# Patient Record
Sex: Female | Born: 1960 | Race: White | Hispanic: No | Smoking: Former smoker
Health system: Southern US, Academic
[De-identification: ages and names within clinical notes are randomized; demographics above are authoritative.]

## PROBLEM LIST (undated history)

## (undated) ENCOUNTER — Inpatient Hospital Stay (HOSPITAL_COMMUNITY): Payer: 59 | Admitting: INTERVENTIONAL CARDIOLOGY

## (undated) ENCOUNTER — Inpatient Hospital Stay (HOSPITAL_COMMUNITY): Payer: 59 | Admitting: CARDIOVASCULAR DISEASE

## (undated) ENCOUNTER — Inpatient Hospital Stay (HOSPITAL_BASED_OUTPATIENT_CLINIC_OR_DEPARTMENT_OTHER): Payer: 59 | Admitting: INTERVENTIONAL CARDIOLOGY

## (undated) DIAGNOSIS — E785 Hyperlipidemia, unspecified: Secondary | ICD-10-CM

## (undated) DIAGNOSIS — C539 Malignant neoplasm of cervix uteri, unspecified: Secondary | ICD-10-CM

## (undated) DIAGNOSIS — F329 Major depressive disorder, single episode, unspecified: Secondary | ICD-10-CM

## (undated) DIAGNOSIS — K589 Irritable bowel syndrome without diarrhea: Secondary | ICD-10-CM

## (undated) DIAGNOSIS — I10 Essential (primary) hypertension: Secondary | ICD-10-CM

## (undated) DIAGNOSIS — M5090 Cervical disc disorder, unspecified, unspecified cervical region: Secondary | ICD-10-CM

## (undated) DIAGNOSIS — K644 Residual hemorrhoidal skin tags: Secondary | ICD-10-CM

## (undated) DIAGNOSIS — M797 Fibromyalgia: Secondary | ICD-10-CM

## (undated) DIAGNOSIS — K648 Other hemorrhoids: Secondary | ICD-10-CM

## (undated) DIAGNOSIS — F32A Depression, unspecified: Secondary | ICD-10-CM

## (undated) DIAGNOSIS — F419 Anxiety disorder, unspecified: Secondary | ICD-10-CM

## (undated) DIAGNOSIS — B0221 Postherpetic geniculate ganglionitis: Secondary | ICD-10-CM

## (undated) DIAGNOSIS — M6289 Other specified disorders of muscle: Secondary | ICD-10-CM

## (undated) DIAGNOSIS — K219 Gastro-esophageal reflux disease without esophagitis: Secondary | ICD-10-CM

## (undated) DIAGNOSIS — I2089 Other forms of angina pectoris (CMS HCC): Secondary | ICD-10-CM

## (undated) DIAGNOSIS — G8929 Other chronic pain: Secondary | ICD-10-CM

## (undated) DIAGNOSIS — F39 Unspecified mood [affective] disorder: Secondary | ICD-10-CM

## (undated) DIAGNOSIS — E0500 Thyrotoxicosis with diffuse goiter without thyrotoxic crisis or storm: Secondary | ICD-10-CM

## (undated) DIAGNOSIS — E079 Disorder of thyroid, unspecified: Secondary | ICD-10-CM

## (undated) DIAGNOSIS — I219 Acute myocardial infarction, unspecified: Secondary | ICD-10-CM

## (undated) DIAGNOSIS — J449 Chronic obstructive pulmonary disease, unspecified: Secondary | ICD-10-CM

## (undated) DIAGNOSIS — H25813 Combined forms of age-related cataract, bilateral: Secondary | ICD-10-CM

## (undated) DIAGNOSIS — H04129 Dry eye syndrome of unspecified lacrimal gland: Secondary | ICD-10-CM

## (undated) DIAGNOSIS — I6529 Occlusion and stenosis of unspecified carotid artery: Secondary | ICD-10-CM

## (undated) DIAGNOSIS — K3184 Gastroparesis: Secondary | ICD-10-CM

## (undated) DIAGNOSIS — Z79891 Long term (current) use of opiate analgesic: Secondary | ICD-10-CM

## (undated) DIAGNOSIS — M1990 Unspecified osteoarthritis, unspecified site: Secondary | ICD-10-CM

## (undated) DIAGNOSIS — R110 Nausea: Secondary | ICD-10-CM

## (undated) DIAGNOSIS — Z9289 Personal history of other medical treatment: Secondary | ICD-10-CM

## (undated) DIAGNOSIS — I2510 Atherosclerotic heart disease of native coronary artery without angina pectoris: Secondary | ICD-10-CM

## (undated) DIAGNOSIS — G7000 Myasthenia gravis without (acute) exacerbation: Secondary | ICD-10-CM

## (undated) DIAGNOSIS — H05839 Thyroid orbitopathy, unspecified orbit: Secondary | ICD-10-CM

## (undated) HISTORY — PX: CHOLECYSTECTOMY: SHX55

## (undated) HISTORY — DX: Hyperlipidemia, unspecified: E78.5

## (undated) HISTORY — PX: ABDOMINAL HYSTERECTOMY: SHX81

## (undated) HISTORY — DX: Essential (primary) hypertension: I10

## (undated) HISTORY — PX: OOPHORECTOMY: SHX86

## (undated) HISTORY — DX: Anxiety disorder, unspecified: F41.9

## (undated) HISTORY — PX: BREAST LUMPECTOMY: SHX2

## (undated) HISTORY — DX: Major depressive disorder, single episode, unspecified: F32.9

## (undated) HISTORY — PX: INGUINAL HERNIA REPAIR: SHX194

## (undated) HISTORY — DX: Malignant neoplasm of cervix uteri, unspecified: C53.9

## (undated) HISTORY — DX: Cervical disc disorder, unspecified, unspecified cervical region: M50.90

## (undated) HISTORY — DX: Irritable bowel syndrome, unspecified: K58.9

## (undated) HISTORY — DX: Residual hemorrhoidal skin tags: K64.4

## (undated) HISTORY — DX: Fibromyalgia: M79.7

## (undated) HISTORY — DX: Depression, unspecified: F32.A

## (undated) HISTORY — PX: COLONOSCOPY: SHX174

## (undated) HISTORY — DX: Residual hemorrhoidal skin tags: K64.8

## (undated) HISTORY — DX: Disorder of thyroid, unspecified: E07.9

## (undated) HISTORY — DX: Personal history of other medical treatment: Z92.89

## (undated) HISTORY — DX: Occlusion and stenosis of unspecified carotid artery: I65.29

## (undated) HISTORY — DX: Other chronic pain: G89.29

## (undated) HISTORY — DX: Long term (current) use of opiate analgesic: Z79.891

## (undated) HISTORY — DX: Chronic obstructive pulmonary disease, unspecified: J44.9

## (undated) HISTORY — DX: Atherosclerotic heart disease of native coronary artery without angina pectoris: I25.10

## (undated) HISTORY — DX: Unspecified osteoarthritis, unspecified site: M19.90

## (undated) HISTORY — DX: Combined forms of age-related cataract, bilateral: H25.813

## (undated) HISTORY — DX: Dry eye syndrome of unspecified lacrimal gland: H04.129

## (undated) HISTORY — DX: Other specified disorders of muscle: M62.89

## (undated) HISTORY — PX: HX LYMPHADENECTOMY: SHX15

## (undated) HISTORY — PX: BREAST SURGERY: SHX581

## (undated) HISTORY — PX: HX LAPAROTOMY: SHX154

## (undated) HISTORY — PX: CARDIAC CATHETERIZATION: SHX172

## (undated) HISTORY — PX: HX VERTEBROPLASTY: SHX113

## (undated) HISTORY — DX: Unspecified mood (affective) disorder: F39

## (undated) HISTORY — DX: Gastroparesis: K31.84

## (undated) HISTORY — DX: Nausea: R11.0

## (undated) HISTORY — DX: Thyroid orbitopathy, unspecified orbit: H05.839

## (undated) HISTORY — DX: Chronic obstructive pulmonary disease, unspecified (CMS HCC): J44.9

## (undated) HISTORY — DX: Thyrotoxicosis with diffuse goiter without thyrotoxic crisis or storm: E05.00

## (undated) HISTORY — DX: Myasthenia gravis without (acute) exacerbation: G70.00

## (undated) HISTORY — DX: Gastro-esophageal reflux disease without esophagitis: K21.9

## (undated) HISTORY — PX: HX HYSTERECTOMY: SHX81

## (undated) HISTORY — DX: Postherpetic geniculate ganglionitis: B02.21

## (undated) HISTORY — DX: Other forms of angina pectoris: I20.89

## (undated) HISTORY — DX: Myasthenia gravis without (acute) exacerbation (CMS HCC): G70.00

## (undated) HISTORY — PX: ESOPHAGOGASTRODUODENOSCOPY: SHX1529

## (undated) HISTORY — PX: HX LAP CHOLECYSTECTOMY: SHX56

## (undated) HISTORY — DX: Acute myocardial infarction, unspecified: I21.9

## (undated) HISTORY — DX: Unspecified mood (affective) disorder (CMS HCC): F39

## (undated) HISTORY — PX: HX GALL BLADDER SURGERY/CHOLE: SHX55

## (undated) HISTORY — DX: Acute myocardial infarction, unspecified (CMS HCC): I21.9

## (undated) HISTORY — DX: Other forms of angina pectoris (CMS HCC): I20.89

---

## 2007-12-29 ENCOUNTER — Ambulatory Visit: Payer: Self-pay | Admitting: Internal Medicine

## 2007-12-29 DIAGNOSIS — M5412 Radiculopathy, cervical region: Secondary | ICD-10-CM | POA: Insufficient documentation

## 2007-12-29 DIAGNOSIS — M5137 Other intervertebral disc degeneration, lumbosacral region: Secondary | ICD-10-CM

## 2007-12-29 DIAGNOSIS — F17200 Nicotine dependence, unspecified, uncomplicated: Secondary | ICD-10-CM | POA: Insufficient documentation

## 2007-12-29 DIAGNOSIS — M5030 Other cervical disc degeneration, unspecified cervical region: Secondary | ICD-10-CM | POA: Insufficient documentation

## 2007-12-30 DIAGNOSIS — I10 Essential (primary) hypertension: Secondary | ICD-10-CM

## 2007-12-30 DIAGNOSIS — E785 Hyperlipidemia, unspecified: Secondary | ICD-10-CM

## 2007-12-30 DIAGNOSIS — Z8541 Personal history of malignant neoplasm of cervix uteri: Secondary | ICD-10-CM | POA: Insufficient documentation

## 2007-12-31 ENCOUNTER — Ambulatory Visit: Payer: Self-pay | Admitting: Internal Medicine

## 2008-01-03 ENCOUNTER — Telehealth: Payer: Self-pay | Admitting: Internal Medicine

## 2008-01-04 ENCOUNTER — Encounter: Admission: RE | Admit: 2008-01-04 | Discharge: 2008-01-04 | Payer: Self-pay | Admitting: Internal Medicine

## 2008-01-05 ENCOUNTER — Telehealth: Payer: Self-pay | Admitting: Internal Medicine

## 2008-01-06 ENCOUNTER — Ambulatory Visit: Payer: Self-pay | Admitting: Internal Medicine

## 2008-01-25 ENCOUNTER — Ambulatory Visit: Payer: Self-pay | Admitting: Internal Medicine

## 2008-01-25 DIAGNOSIS — F341 Dysthymic disorder: Secondary | ICD-10-CM

## 2008-04-21 ENCOUNTER — Ambulatory Visit: Payer: Self-pay | Admitting: Internal Medicine

## 2008-06-05 ENCOUNTER — Ambulatory Visit: Payer: Self-pay | Admitting: Internal Medicine

## 2008-07-14 ENCOUNTER — Ambulatory Visit: Payer: Self-pay | Admitting: Internal Medicine

## 2008-07-18 ENCOUNTER — Telehealth: Payer: Self-pay | Admitting: Internal Medicine

## 2008-07-19 ENCOUNTER — Emergency Department (HOSPITAL_COMMUNITY): Admission: EM | Admit: 2008-07-19 | Discharge: 2008-07-19 | Payer: Self-pay | Admitting: Emergency Medicine

## 2008-07-20 ENCOUNTER — Ambulatory Visit: Payer: Self-pay | Admitting: Internal Medicine

## 2008-07-22 ENCOUNTER — Encounter: Admission: RE | Admit: 2008-07-22 | Discharge: 2008-07-22 | Payer: Self-pay | Admitting: Internal Medicine

## 2008-07-25 ENCOUNTER — Telehealth: Payer: Self-pay | Admitting: Internal Medicine

## 2008-07-28 ENCOUNTER — Telehealth: Payer: Self-pay | Admitting: Internal Medicine

## 2008-07-28 ENCOUNTER — Encounter: Payer: Self-pay | Admitting: Internal Medicine

## 2008-07-31 ENCOUNTER — Ambulatory Visit: Payer: Self-pay | Admitting: Internal Medicine

## 2008-10-11 ENCOUNTER — Telehealth: Payer: Self-pay | Admitting: Internal Medicine

## 2008-10-12 ENCOUNTER — Telehealth: Payer: Self-pay | Admitting: Internal Medicine

## 2008-11-09 ENCOUNTER — Telehealth: Payer: Self-pay | Admitting: Internal Medicine

## 2008-12-05 ENCOUNTER — Ambulatory Visit: Payer: Self-pay | Admitting: Internal Medicine

## 2008-12-07 ENCOUNTER — Telehealth (INDEPENDENT_AMBULATORY_CARE_PROVIDER_SITE_OTHER): Payer: Self-pay | Admitting: *Deleted

## 2008-12-08 ENCOUNTER — Telehealth (INDEPENDENT_AMBULATORY_CARE_PROVIDER_SITE_OTHER): Payer: Self-pay | Admitting: *Deleted

## 2008-12-11 ENCOUNTER — Encounter: Payer: Self-pay | Admitting: Internal Medicine

## 2008-12-11 ENCOUNTER — Ambulatory Visit: Payer: Self-pay

## 2008-12-13 ENCOUNTER — Encounter: Payer: Self-pay | Admitting: Internal Medicine

## 2009-01-22 ENCOUNTER — Telehealth: Payer: Self-pay | Admitting: Internal Medicine

## 2009-01-24 ENCOUNTER — Ambulatory Visit: Payer: Self-pay | Admitting: Internal Medicine

## 2009-05-02 ENCOUNTER — Ambulatory Visit: Payer: Self-pay | Admitting: Internal Medicine

## 2009-05-23 ENCOUNTER — Ambulatory Visit: Payer: Self-pay | Admitting: Internal Medicine

## 2009-06-04 ENCOUNTER — Telehealth: Payer: Self-pay | Admitting: Internal Medicine

## 2009-07-03 ENCOUNTER — Ambulatory Visit: Payer: Self-pay | Admitting: Internal Medicine

## 2009-07-03 LAB — CONVERTED CEMR LAB
ALT: 12 units/L (ref 0–35)
Amylase: 116 units/L (ref 27–131)
BUN: 10 mg/dL (ref 6–23)
Basophils Absolute: 0 10*3/uL (ref 0.0–0.1)
Chloride: 103 meq/L (ref 96–112)
Glucose, Bld: 98 mg/dL (ref 70–99)
HCT: 39.2 % (ref 36.0–46.0)
Lymphs Abs: 2.8 10*3/uL (ref 0.7–4.0)
MCHC: 34.5 g/dL (ref 30.0–36.0)
MCV: 92.4 fL (ref 78.0–100.0)
Monocytes Absolute: 0.4 10*3/uL (ref 0.1–1.0)
Platelets: 323 10*3/uL (ref 150.0–400.0)
Potassium: 4.3 meq/L (ref 3.5–5.1)
RDW: 12.9 % (ref 11.5–14.6)
TSH: 0.79 microintl units/mL (ref 0.35–5.50)
Total Bilirubin: 0.5 mg/dL (ref 0.3–1.2)
Urine Glucose: NEGATIVE mg/dL
Urobilinogen, UA: 0.2 (ref 0.0–1.0)

## 2009-07-04 ENCOUNTER — Telehealth: Payer: Self-pay | Admitting: Internal Medicine

## 2009-07-10 ENCOUNTER — Telehealth: Payer: Self-pay | Admitting: Internal Medicine

## 2009-07-18 ENCOUNTER — Encounter: Payer: Self-pay | Admitting: Internal Medicine

## 2009-07-18 LAB — HM MAMMOGRAPHY: HM Mammogram: NORMAL

## 2009-08-15 ENCOUNTER — Ambulatory Visit: Payer: Self-pay | Admitting: Internal Medicine

## 2009-08-15 DIAGNOSIS — M549 Dorsalgia, unspecified: Secondary | ICD-10-CM | POA: Insufficient documentation

## 2009-09-04 ENCOUNTER — Telehealth: Payer: Self-pay | Admitting: Internal Medicine

## 2009-09-04 ENCOUNTER — Ambulatory Visit: Payer: Self-pay | Admitting: Internal Medicine

## 2009-09-06 ENCOUNTER — Ambulatory Visit (HOSPITAL_COMMUNITY): Admission: RE | Admit: 2009-09-06 | Discharge: 2009-09-06 | Payer: Self-pay | Admitting: Internal Medicine

## 2009-09-13 ENCOUNTER — Telehealth: Payer: Self-pay | Admitting: Internal Medicine

## 2009-10-04 ENCOUNTER — Telehealth: Payer: Self-pay | Admitting: Internal Medicine

## 2009-10-12 ENCOUNTER — Emergency Department (HOSPITAL_COMMUNITY): Admission: EM | Admit: 2009-10-12 | Discharge: 2009-10-12 | Payer: Self-pay | Admitting: Family Medicine

## 2009-10-17 ENCOUNTER — Other Ambulatory Visit: Admission: RE | Admit: 2009-10-17 | Discharge: 2009-10-17 | Payer: Self-pay | Admitting: Internal Medicine

## 2009-10-17 ENCOUNTER — Ambulatory Visit: Payer: Self-pay | Admitting: Internal Medicine

## 2009-10-17 LAB — CONVERTED CEMR LAB
ALT: 19 units/L (ref 0–35)
Alkaline Phosphatase: 81 units/L (ref 39–117)
Basophils Absolute: 0 10*3/uL (ref 0.0–0.1)
Bilirubin, Direct: 0.1 mg/dL (ref 0.0–0.3)
CO2: 27 meq/L (ref 19–32)
Chloride: 103 meq/L (ref 96–112)
Cholesterol: 249 mg/dL — ABNORMAL HIGH (ref 0–200)
HDL: 47.5 mg/dL (ref >39.00)
Hemoglobin: 12.8 g/dL (ref 12.0–15.0)
Lymphocytes Relative: 24.6 % (ref 12.0–46.0)
Monocytes Relative: 3.6 % (ref 3.0–12.0)
Neutrophils Relative %: 69.1 % (ref 43.0–77.0)
Platelets: 268 10*3/uL (ref 150.0–400.0)
Potassium: 4.4 meq/L (ref 3.5–5.1)
RDW: 13.1 % (ref 11.5–14.6)
Sodium: 137 meq/L (ref 135–145)
Total Bilirubin: 0.4 mg/dL (ref 0.3–1.2)
Total CHOL/HDL Ratio: 5
Total Protein: 6.8 g/dL (ref 6.0–8.3)
Triglycerides: 248 mg/dL — ABNORMAL HIGH (ref 0.0–149.0)
VLDL: 49.6 mg/dL — ABNORMAL HIGH (ref 0.0–40.0)

## 2009-10-17 LAB — HM PAP SMEAR

## 2009-11-28 ENCOUNTER — Encounter (INDEPENDENT_AMBULATORY_CARE_PROVIDER_SITE_OTHER): Payer: Self-pay | Admitting: *Deleted

## 2009-11-30 ENCOUNTER — Ambulatory Visit: Payer: Self-pay | Admitting: Internal Medicine

## 2009-12-04 ENCOUNTER — Telehealth: Payer: Self-pay | Admitting: Internal Medicine

## 2009-12-05 ENCOUNTER — Ambulatory Visit: Payer: Self-pay | Admitting: Internal Medicine

## 2009-12-07 ENCOUNTER — Telehealth: Payer: Self-pay | Admitting: Internal Medicine

## 2009-12-12 ENCOUNTER — Telehealth: Payer: Self-pay | Admitting: Internal Medicine

## 2009-12-25 ENCOUNTER — Telehealth: Payer: Self-pay | Admitting: Internal Medicine

## 2009-12-26 ENCOUNTER — Ambulatory Visit: Payer: Self-pay | Admitting: Internal Medicine

## 2009-12-26 DIAGNOSIS — G43009 Migraine without aura, not intractable, without status migrainosus: Secondary | ICD-10-CM | POA: Insufficient documentation

## 2010-01-10 ENCOUNTER — Ambulatory Visit: Payer: Self-pay | Admitting: Internal Medicine

## 2010-01-10 DIAGNOSIS — R112 Nausea with vomiting, unspecified: Secondary | ICD-10-CM

## 2010-01-10 LAB — CONVERTED CEMR LAB
ALT: 14 units/L (ref 0–35)
AST: 18 units/L (ref 0–37)
Albumin: 4.7 g/dL (ref 3.5–5.2)
BUN: 20 mg/dL (ref 6–23)
Chloride: 105 meq/L (ref 96–112)
Eosinophils Relative: 2.7 % (ref 0.0–5.0)
Glucose, Bld: 91 mg/dL (ref 70–99)
HCT: 39.5 % (ref 36.0–46.0)
Hemoglobin: 13.7 g/dL (ref 12.0–15.0)
Lymphs Abs: 2.5 10*3/uL (ref 0.7–4.0)
MCV: 94.3 fL (ref 78.0–100.0)
Monocytes Absolute: 0.3 10*3/uL (ref 0.1–1.0)
Neutro Abs: 6.6 10*3/uL (ref 1.4–7.7)
Platelets: 298 10*3/uL (ref 150.0–400.0)
Potassium: 5 meq/L (ref 3.5–5.1)
RDW: 12.7 % (ref 11.5–14.6)
Sed Rate: 13 mm/hr (ref 0–22)
Sodium: 140 meq/L (ref 135–145)
TSH: 1 microintl units/mL (ref 0.35–5.50)
Total Bilirubin: 0.5 mg/dL (ref 0.3–1.2)
WBC: 9.7 10*3/uL (ref 4.5–10.5)

## 2010-01-16 ENCOUNTER — Emergency Department (HOSPITAL_COMMUNITY): Admission: EM | Admit: 2010-01-16 | Discharge: 2010-01-16 | Payer: Self-pay | Admitting: Emergency Medicine

## 2010-01-16 ENCOUNTER — Telehealth: Payer: Self-pay | Admitting: Internal Medicine

## 2010-01-23 ENCOUNTER — Telehealth: Payer: Self-pay | Admitting: Internal Medicine

## 2010-01-24 ENCOUNTER — Ambulatory Visit: Payer: Self-pay | Admitting: Internal Medicine

## 2010-03-21 ENCOUNTER — Ambulatory Visit: Payer: Self-pay | Admitting: Internal Medicine

## 2010-04-25 ENCOUNTER — Telehealth: Payer: Self-pay | Admitting: Internal Medicine

## 2010-04-30 NOTE — Progress Notes (Signed)
Summary: OV?   Phone Note Call from Patient   Summary of Call: Pt c/o seeing "halo's", eye pain, h/a's and nausea x 2 wks. Should she see PCP or eye md?  Initial call taken by: Lamar Sprinkles, CMA,  December 25, 2009 9:43 AM  Follow-up for Phone Call        eye md Follow-up by: Etta Grandchild MD,  December 25, 2009 10:26 AM  Additional Follow-up for Phone Call Additional follow up Details #1::        Pt informed, she saw her eye md today and they said PCP must eval. Pt scheduled for office visit tomorrow.  Additional Follow-up by: Lamar Sprinkles, CMA,  December 25, 2009 11:55 AM

## 2010-04-30 NOTE — Progress Notes (Signed)
Summary: refills  Phone Note Refill Request   Refills Requested: Medication #1:  TEMAZEPAM 30 MG CAPS Take 1 tab by mouth at bedtime   Dosage confirmed as above?Dosage Confirmed   Supply Requested: 1 month  Medication #2:  VICODIN ES 7.5-750 MG TABS 3-4 once daily   Dosage confirmed as above?Dosage Confirmed   Supply Requested: 1 month  Are these rx ok to refill for patient?   Method Requested: Telephone to Pharmacy Initial call taken by: Rock Nephew CMA,  June 04, 2009 9:06 AM  Follow-up for Phone Call        yes Follow-up by: Etta Grandchild MD,  June 04, 2009 10:40 AM    Prescriptions: TEMAZEPAM 30 MG CAPS (TEMAZEPAM) Take 1 tab by mouth at bedtime  #30 x 3   Entered by:   Rock Nephew CMA   Authorized by:   Etta Grandchild MD   Signed by:   Rock Nephew CMA on 06/05/2009   Method used:   Telephoned to ...       [facility]  [address]  [phone]* (retail)       [address]       [place], [place]  [address]       Ph: [phone] or [phone]       Fax: [phone]   RxID:   [id number] VICODIN ES 7.5-750 MG TABS (HYDROCODONE-ACETAMINOPHEN) 3-4 once daily  #120 x 3   Entered by:   Rock Nephew CMA   Authorized by:   Etta Grandchild MD   Signed by:   Rock Nephew CMA on 06/05/2009   Method used:   Telephoned to ...       [facility]  [address]  [phone]* (retail)       [address]       [place], [place]  [address]       Ph: [phone] or [phone]       Fax: [phone]   RxID:   [id number]

## 2010-04-30 NOTE — Procedures (Signed)
Summary: Colonoscopy  Patient: [name] Note: All result statuses are Final unless otherwise noted.  Tests: (1) Colonoscopy (COL)   COL Colonoscopy           DONE     [facility]     [address]     [place], [place]  [address]           COLONOSCOPY PROCEDURE REPORT           PATIENT:  Barbara [name], Barbara [name]  MR#:  [id number]     BIRTHDATE:  1961-02-16, 49 yrs. old  GENDER:  female     ENDOSCOPIST:  Iva Boop, MD, [facility]     REF. BY:  Etta Grandchild, M.D.     PROCEDURE DATE:  01/24/2010     PROCEDURE:  Colonoscopy 19[address]     ASA CLASS:  Class II     INDICATIONS:  Routine Risk Screening     MEDICATIONS:   Fentanyl 100 mcg IV, Versed 12 mg IV, Benadryl 12.5     mg IV           DESCRIPTION OF PROCEDURE:   After the risks benefits and     alternatives of the procedure were thoroughly explained, informed     consent was obtained.  Digital rectal exam was performed and     revealed no abnormalities.   The LB [id number]-[id number] endoscope     was introduced through the anus and advanced to the cecum, which     was identified by both the appendix and ileocecal valve, without     limitations.  The quality of the prep was good, using MoviPrep.     The instrument was then slowly withdrawn as the colon was fully     examined. Insertion: 4:36 minutes Withdrawal: 8:15 minutes     <<PROCEDUREIMAGES>>           FINDINGS:  A normal appearing cecum, ileocecal valve, and     appendiceal orifice were identified. The ascending, hepatic     flexure, transverse, splenic flexure, descending, sigmoid colon,     and rectum appeared unremarkable.   Retroflexed views in the     rectum revealed internal and external hemorrhoids.    The scope     was then withdrawn from the patient and the procedure completed.           COMPLICATIONS:  None     ENDOSCOPIC IMPRESSION:     1) Internal and external hemorrhoids     2) Normal colonoscopy otherwise     3) Suboptimal response to moderate sedation    RECOMMENDATIONS:     Deep sedation (Propofol) for future procedures           REPEAT EXAM:  In 10 year(s) for routine screening colonoscopy.           Iva Boop, MD, [facility]           CC:  The Patient     Etta Grandchild, MD           n.     Rosalie Doctor:   Iva Boop at 01/24/2010 12:24 PM           Marva Panda, [phone]  Note: An exclamation mark (!) indicates a result that was not dispersed into the flowsheet. Document Creation Date: 01/24/2010 12:25 PM _______________________________________________________________________  (1) Order result status: Final Collection or observation date-time: 01/24/2010 12:17 Requested date-time:  Receipt date-time:  Reported date-time:  Referring Physician:   Ordering Physician: Stan Head [phone]) Specimen Source:  Source: Launa Grill Order Number: [id number] Lab site:   Appended Document: Colonoscopy    Clinical Lists Changes  Observations: Added new observation of COLONNXTDUE: 12/2019 (01/24/2010 12:48)

## 2010-04-30 NOTE — Letter (Signed)
Summary: Barbara [name] Instructions  Barbara [name]  [address] [place], [place] [address]   Phone: [phone]  Fax: [phone]       Barbara [name]    01-14-1961    MRN: [id number]        Procedure Day Barbara [name]:  Barbara [name]  12/13/09     Arrival Time:  12:30PM      Procedure Time:  1:30PM     Location of Procedure:                    _X _  Barbara [name] (4th Floor)  PREPARATION FOR COLONOSCOPY WITH MOVIPREP   Starting 5 days prior to your procedure 12/08/09 do not eat nuts, seeds, popcorn, corn, beans, peas,  salads, or any raw vegetables.  Do not take any fiber supplements (e.g. Metamucil, Citrucel, and Benefiber).  THE DAY BEFORE YOUR PROCEDURE         DATE: 12/12/09  DAY: WEDNESDAY  1.  Drink clear liquids the entire day-NO SOLID FOOD  2.  Do not drink anything colored red or purple.  Avoid juices with pulp.  No orange juice.  3.  Drink at least 64 oz. (8 glasses) of fluid/clear liquids during the day to prevent dehydration and help the prep work efficiently.  CLEAR LIQUIDS INCLUDE: Water Jello Ice Popsicles Tea (sugar ok, no milk/cream) Powdered fruit flavored drinks Coffee (sugar ok, no milk/cream) Gatorade Juice: apple, white grape, white cranberry  Lemonade Clear bullion, consomm, broth Carbonated beverages (any kind) Strained chicken noodle soup Hard Candy                             4.  In the morning, mix first dose of MoviPrep solution:    Empty 1 Pouch A and 1 Pouch B into the disposable container    Add lukewarm drinking water to the top line of the container. Mix to dissolve    Refrigerate (mixed solution should be used within 24 hrs)  5.  Begin drinking the prep at 5:00 p.m. The MoviPrep container is divided by 4 marks.   Every 15 minutes drink the solution down to the next mark (approximately 8 oz) until the full liter is complete.   6.  Follow completed prep with 16 oz of clear liquid of your choice (Nothing red or purple).   Continue to drink clear liquids until bedtime.  7.  Before going to bed, mix second dose of MoviPrep solution:    Empty 1 Pouch A and 1 Pouch B into the disposable container    Add lukewarm drinking water to the top line of the container. Mix to dissolve    Refrigerate  THE DAY OF YOUR PROCEDURE      DATE: 12/13/09  DAY: THURSDAY  Beginning at 8:30AM (5 hours before procedure):         1. Every 15 minutes, drink the solution down to the next mark (approx 8 oz) until the full liter is complete.  2. Follow completed prep with 16 oz. of clear liquid of your choice.    3. You may drink clear liquids until 11:30AM (2 HOURS BEFORE PROCEDURE).   MEDICATION INSTRUCTIONS  Unless otherwise instructed, you should take regular prescription medications with a small sip of water   as early as possible the morning of your procedure.    Additional medication instructions: n/a         OTHER INSTRUCTIONS  You will need a responsible adult at least 51 years of age to accompany you and drive you home.   This person must remain in the waiting room during your procedure.  Wear loose fitting clothing that is easily removed.  Leave jewelry and other valuables at home.  However, you may wish to bring a book to read or  an iPod/MP3 player to listen to music as you wait for your procedure to start.  Remove all body piercing jewelry and leave at home.  Total time from sign-in until discharge is approximately 2-3 hours.  You should go home directly after your procedure and rest.  You can resume normal activities the  day after your procedure.  The day of your procedure you should not:   Drive   Make legal decisions   Operate machinery   Drink alcohol   Return to work  You will receive specific instructions about eating, activities and medications before you leave.    The above instructions have been reviewed and explained to me by   Sherren Kerns RN  November 30, 2009 10:49  AM    I fully understand and can verbalize these instructions _____________________________ Date _________

## 2010-04-30 NOTE — Assessment & Plan Note (Signed)
Summary: FU AND REFILLS /NWS #   Vital Signs:  Patient profile:   50 year old female Height:      59 inches Weight:      113 pounds O2 Sat:      98 % on Room air Temp:     98.3 degrees F oral Pulse rate:   72 / minute Pulse rhythm:   regular Resp:     16 per minute BP sitting:   124 / 72  (left arm)  O2 Flow:  Room air  Primary Care Provider:  Etta Grandchild MD  CC:  URI symptoms.  History of Present Illness:  URI Symptoms      This is a 50 year old woman who presents with URI symptoms.  The symptoms began 2 weeks ago.  The severity is described as mild.  The patient reports productive cough, but denies nasal congestion, clear nasal discharge, purulent nasal discharge, sore throat, earache, and sick contacts.  The patient denies fever, stiff neck, dyspnea, wheezing, rash, vomiting, diarrhea, and use of an antipyretic.  The patient also reports itchy throat and muscle aches.  The patient denies headache and severe fatigue.  Risk factors for Strep sinusitis include double sickening.  The patient denies the following risk factors for Strep sinusitis: unilateral facial pain, unilateral nasal discharge, tender adenopathy, and absence of cough.    Preventive Screening-Counseling & Management  Alcohol-Tobacco     Alcohol drinks/day: 0     Smoking Status: current     Smoking Cessation Counseling: yes     Smoke Cessation Stage: ready     Packs/Day: 1     Cans of tobacco/week: no     Passive Smoke Exposure: yes  Hep-HIV-STD-Contraception     Hepatitis Risk: no risk noted     HIV Risk: no     STD Risk: no risk noted  Medications Prior to Update: 1)  Alprazolam 0.5 Mg Tabs (Alprazolam) .... Take 1 Tablet By Mouth Two Times A Day As Needed For Anxiety 2)  Vicodin Es 7.5-750 Mg Tabs (Hydrocodone-Acetaminophen) .... 3-4 Once Daily 3)  Temazepam 30 Mg Caps (Temazepam) .... Take 1 Tab By Mouth At Bedtime 4)  Lisinopril 20 Mg Tabs (Lisinopril) .... Take 1 Tablet By Mouth Once A  Day  Allergies (verified): 1)  ! Penicillin 2)  ! Erythromycin 3)  ! Codeine  Past History:  Past Medical History: Reviewed history from 12/29/2007 and no changes required. Cervical cancer, hx of Hyperlipidemia Hypertension  Past Surgical History: Reviewed history from 12/29/2007 and no changes required. Cholecystectomy Inguinal herniorrhaphy Hysterectomy Oophorectomy  Family History: Reviewed history from 12/29/2007 and no changes required. adopted  Social History: Reviewed history from 07/20/2008 and no changes required. Occupation: she is disabled, she is  a Physicist, medical in graduate history program [employer] Single Current Smoker Drug use-no Regular exercise-yes Alcohol use-no Hepatitis Risk:  no risk noted STD Risk:  no risk noted  Review of Systems  The patient denies anorexia, fever, weight loss, chest pain, syncope, dyspnea on exertion, peripheral edema, headaches, hemoptysis, abdominal pain, difficulty walking, depression, enlarged lymph nodes, and angioedema.   Psych:  Complains of anxiety; denies alternate hallucination ( auditory/visual), depression, easily angered, easily tearful, irritability, mental problems, panic attacks, sense of great danger, suicidal thoughts/plans, and thoughts /plans of harming others.  Physical Exam  General:  alert, well-developed, well-nourished, well-hydrated, and appropriate dress.   Head:  normocephalic, atraumatic, no abnormalities observed, and no abnormalities palpated.   Ears:  R ear normal and L ear normal.   Nose:  no mucosal edema, no airflow obstruction, no intranasal foreign body, no nasal polyps, no nasal mucosal lesions, no mucosal friability, no active bleeding or clots, no sinus percussion tenderness, no septum abnormalities, and nasal dischargemucosal pallor.   Mouth:  Oral mucosa and oropharynx without lesions or exudates.  Teeth in good repair. Neck:  supple, full ROM, no masses, no thyromegaly, and no  thyroid nodules or tenderness.   Lungs:  Normal respiratory effort, chest expands symmetrically. Lungs are clear to auscultation, no crackles or wheezes. Heart:  Normal rate and regular rhythm. S1 and S2 normal without gallop, murmur, click, rub or other extra sounds. Abdomen:  soft, non-tender, normal bowel sounds, no distention, no masses, no hepatomegaly, and no splenomegaly.   Msk:  normal ROM, no joint tenderness, and no joint swelling.   Pulses:  R and L carotid,radial,femoral,dorsalis pedis and posterior tibial pulses are full and equal bilaterally Extremities:  No clubbing, cyanosis, edema, or deformity noted with normal full range of motion of all joints.   Neurologic:  No cranial nerve deficits noted. Station and gait are normal. Plantar reflexes are down-going bilaterally. DTRs are symmetrical throughout. Sensory, motor and coordinative functions appear intact. Skin:  Intact without suspicious lesions or rashes Cervical Nodes:  no anterior cervical adenopathy and no posterior cervical adenopathy.   Axillary Nodes:  no R axillary adenopathy and no L axillary adenopathy.   Psych:  Oriented X3, memory intact for recent and remote, normally interactive, good eye contact, not depressed appearing, not agitated, not suicidal, not homicidal, and slightly anxious.     Impression & Recommendations:  Problem # 1:  BRONCHITIS-ACUTE (ICD-466.0) Assessment New  Her updated medication list for this problem includes:    Avelox Abc Pack 400 Mg Tabs (Moxifloxacin hcl) ..... 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   Complete Medication List: 1)  Alprazolam 0.5 Mg Tabs (Alprazolam) .... Take 1 tablet by mouth two times a day as needed  for anxiety 2)  Vicodin Es 7.5-750 Mg Tabs (Hydrocodone-acetaminophen) .... 3-4 once daily 3)  Temazepam 30 Mg Caps (Temazepam) .... Take 1 tab by mouth at bedtime 4)  Lisinopril 20 Mg Tabs (Lisinopril) .... Take 1 tablet by mouth once a day 5)  Avelox Abc Pack 400 Mg Tabs (Moxifloxacin hcl) .... One by mouth once daily for 5 days  Patient Instructions: 1)  Please schedule a follow-up appointment in 1 month. 2)  Tobacco is very bad for your health and your loved ones! You Should stop smoking!. 3)  Stop Smoking Tips: Choose a Quit date. Cut down before the Quit date. decide what you will do as a substitute when you feel the urge to smoke(gum,toothpick,exercise). 4)  It is important that you exercise regularly at least 20 minutes 5 times a week. If you develop chest pain, have severe difficulty breathing, or feel very tired , stop exercising immediately and seek medical attention. 5)  You need to lose weight. Consider a lower calorie diet and regular exercise.  6)  Check your Blood Pressure regularly. If it is above 130/80: you should make an appointment. 7)  Take your antibiotic as prescribed until ALL of it is gone, but stop if you develop  a rash or swelling and contact our office as soon as possible. 8)  Acute sinusitis symptoms for less than 10 days are not helped by antibiotics.Use warm moist compresses, and over the counter decongestants ( only as directed). Call if no improvement in 5-7 days, sooner if increasing pain, fever, or new symptoms. Prescriptions: ALPRAZOLAM 0.5 MG TABS (ALPRAZOLAM) Take 1 tablet by mouth two times a day as needed for anxiety  #60 x 3   Entered and Authorized by:   Etta Grandchild MD   Signed by:   Etta Grandchild MD on 05/02/2009   Method used:   Print then Give to Patient   RxID:   [id number] AVELOX ABC PACK 400 MG TABS (MOXIFLOXACIN HCL) One by mouth once daily for 5 days  #5 x 0   Entered and Authorized by:   Etta Grandchild MD   Signed by:   Etta Grandchild MD on 05/02/2009   Method used:   Print then Give to Patient   RxID:   [id number]

## 2010-04-30 NOTE — Miscellaneous (Signed)
Summary: Previsit prep/RM  Clinical Lists Changes  Medications: Added new medication of MOVIPREP 100 GM  SOLR (PEG-KCL-NACL-NASULF-NA ASC-C) As per prep instructions. - Signed Rx of MOVIPREP 100 GM  SOLR (PEG-KCL-NACL-NASULF-NA ASC-C) As per prep instructions.;  #1 x 0;  Signed;  Entered by: Sherren Kerns RN;  Authorized by: Iva Boop MD, [facility];  Method used: Electronically to [facility]  [phone]*, [address], [place], [place], [place]  [address], Ph: [phone] or [phone], Fax: [phone] Observations: Added new observation of ALLERGY REV: Done (11/30/2009 10:16)    Prescriptions: MOVIPREP 100 GM  SOLR (PEG-KCL-NACL-NASULF-NA ASC-C) As per prep instructions.  #1 x 0   Entered by:   Sherren Kerns RN   Authorized by:   Iva Boop MD, [facility]   Signed by:   Sherren Kerns RN on 11/30/2009   Method used:   Electronically to        [facility]  [phone]* (retail)       [address]       [place], [place]  [address]       Ph: [phone] or [phone]       Fax: [phone]   RxID:   [id number]

## 2010-04-30 NOTE — Letter (Signed)
Summary: Lipid Letter  [facility]  [address] [place], [place] [address]   Phone: [phone]  Fax: [phone]    10/17/2009  Barbara [name] [address] [place], [place]  [address]  Dear Barbara [name]:  We have carefully reviewed your last lipid profile from  and the results are noted below with a summary of recommendations for lipid management.    Cholesterol:       249     Goal: <200   HDL "good" Cholesterol:   57.84     Goal: >40   LDL "bad" Cholesterol:   160     Goal: <130   Triglycerides:       248.0     Goal: <150   WOW!        TLC Diet (Therapeutic Lifestyle Change): Saturated Fats & Transfatty acids should be kept < 7% of total calories ***Reduce Saturated Fats Polyunstaurated Fat can be up to 10% of total calories Monounsaturated Fat Fat can be up to 20% of total calories Total Fat should be no greater than 25-35% of total calories Carbohydrates should be 50-60% of total calories Protein should be approximately 15% of total calories Fiber should be at least 20-30 grams a day ***Increased fiber may help lower LDL Total Cholesterol should be < 200mg /day Consider adding plant stanol/sterols to diet (example: Benacol spread) ***A higher intake of unsaturated fat may reduce Triglycerides and Increase HDL    Adjunctive Measures (may lower LIPIDS and reduce risk of Heart Attack) include: Aerobic Exercise (20-30 minutes 3-4 times a week) Limit Alcohol Consumption Weight Reduction Aspirin 75-81 mg a day by mouth (if not allergic or contraindicated) Dietary Fiber 20-30 grams a day by mouth     Current Medications: 1)    Alprazolam 0.5 Mg Tabs (Alprazolam) .... Take 1 tablet by mouth two times a day as needed for anxiety 2)    Vicodin Es 7.5-750 Mg Tabs (Hydrocodone-acetaminophen) .... 3-4 once daily 3)    Temazepam 30 Mg Caps (Temazepam) .... Take 1 tab by mouth at bedtime 4)    Lisinopril 20 Mg Tabs (Lisinopril) .... Take 1 tablet by mouth once a  day 5)    Celebrex 200 Mg Caps (Celecoxib) .... One by mouth once daily as needed for pain  If you have any questions, please call. We appreciate being able to work with you.   Sincerely,    [facility] Etta Grandchild MD

## 2010-04-30 NOTE — Assessment & Plan Note (Signed)
Summary: back/neck pain/#/cd   Vital Signs:  Patient profile:   50 year old female Height:      59 inches Weight:      110.75 pounds BMI:     22.45 O2 Sat:      97 % on Room air Temp:     97.7 degrees F oral Pulse rate:   76 / minute Pulse rhythm:   regular Resp:     16 per minute BP sitting:   120 / 64  (left arm) Cuff size:   large  Vitals Entered By: Rock Nephew CMA (September 04, 2009 9:53 AM)  O2 Flow:  Room air CC: continued neck and back pain Is Patient Diabetic? No Pain Assessment Patient in pain? no        Primary Care Provider:  Etta Grandchild MD  CC:  continued neck and back pain.  History of Present Illness: She returns with a one month hx. of worsening neck pain that causes a pulling sensation over her right trapezius but does not radiate into her arms or legs. She wants to get an MRI done. Celebrex has helped with pain, especially in her knees.  Preventive Screening-Counseling & Management  Alcohol-Tobacco     Alcohol drinks/day: 0     Smoking Status: current     Smoking Cessation Counseling: yes     Smoke Cessation Stage: contemplative     Packs/Day: 0.5     Cans of tobacco/week: no     Passive Smoke Exposure: yes  Hep-HIV-STD-Contraception     Hepatitis Risk: no risk noted     HIV Risk: no     STD Risk: no risk noted  Clinical Review Panels:  Diabetes Management   Creatinine:  0.7 (07/03/2009)  CBC   WBC:  10.1 (07/03/2009)   RBC:  4.24 (07/03/2009)   Hgb:  13.6 (07/03/2009)   Hct:  39.2 (07/03/2009)   Platelets:  323.0 (07/03/2009)   MCV  92.4 (07/03/2009)   MCHC  34.5 (07/03/2009)   RDW  12.9 (07/03/2009)   PMN:  65.7 (07/03/2009)   Lymphs:  28.1 (07/03/2009)   Monos:  4.3 (07/03/2009)   Eosinophils:  1.4 (07/03/2009)   Basophil:  0.5 (07/03/2009)  Complete Metabolic Panel   Glucose:  98 (07/03/2009)   Sodium:  140 (07/03/2009)   Potassium:  4.3 (07/03/2009)   Chloride:  103 (07/03/2009)   CO2:  30 (07/03/2009)   BUN:  10  (07/03/2009)   Creatinine:  0.7 (07/03/2009)   Albumin:  4.7 (07/03/2009)   Total Protein:  7.5 (07/03/2009)   Calcium:  10.0 (07/03/2009)   Total Bili:  0.5 (07/03/2009)   Alk Phos:  92 (07/03/2009)   SGPT (ALT):  12 (07/03/2009)   SGOT (AST):  18 (07/03/2009)   Medications Prior to Update: 1)  Alprazolam 0.5 Mg Tabs (Alprazolam) .... Take 1 Tablet By Mouth Two Times A Day As Needed For Anxiety 2)  Vicodin Es 7.5-750 Mg Tabs (Hydrocodone-Acetaminophen) .... 3-4 Once Daily 3)  Temazepam 30 Mg Caps (Temazepam) .... Take 1 Tab By Mouth At Bedtime 4)  Lisinopril 20 Mg Tabs (Lisinopril) .... Take 1 Tablet By Mouth Once A Day 5)  Celebrex 200 Mg Caps (Celecoxib) .... One By Mouth Once Daily As Needed For Pain  Current Medications (verified): 1)  Alprazolam 0.5 Mg Tabs (Alprazolam) .... Take 1 Tablet By Mouth Two Times A Day As Needed For Anxiety 2)  Vicodin Es 7.5-750 Mg Tabs (Hydrocodone-Acetaminophen) .... 3-4 Once Daily 3)  Temazepam 30 Mg Caps (Temazepam) .... Take 1 Tab By Mouth At Bedtime 4)  Lisinopril 20 Mg Tabs (Lisinopril) .... Take 1 Tablet By Mouth Once A Day 5)  Celebrex 200 Mg Caps (Celecoxib) .... One By Mouth Once Daily As Needed For Pain  Allergies (verified): 1)  ! Penicillin 2)  ! Erythromycin 3)  ! 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 adopted  Social History: Reviewed history from 07/20/2008 and no changes required. 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 Neuro:  Denies brief paralysis, difficulty with concentration, disturbances in coordination, falling down, headaches, numbness, poor balance, tingling, tremors, and weakness.  Physical Exam  General:  Well-developed, well-nourished, in no acute distress; alert and oriented x 3.   Head:  normocephalic, atraumatic, no abnormalities observed, and no abnormalities palpated.   Mouth:  good dentition, no exudates, no posterior lymphoid hypertrophy, no postnasal drip, no pharyngeal crowing, no lesions, no erosions, no tongue abnormalities, no leukoplakia, no petechiae, pharyngeal erythema, and edentulous.   Neck:  supple, full ROM, no masses, no thyromegaly, no JVD, no carotid bruits, and no cervical lymphadenopathy.   Lungs:  normal respiratory effort, no intercostal retractions, no accessory muscle use, normal breath sounds, no dullness, no fremitus, no crackles, and no wheezes.   Heart:  normal rate, regular rhythm, no murmur, no gallop, no rub, and no JVD.   Abdomen:  soft, non-tender, normal bowel sounds, no distention, no masses, no guarding, no rigidity, no rebound tenderness, no abdominal hernia, no inguinal hernia, no hepatomegaly, and no splenomegaly.   Msk:  normal ROM, no joint tenderness, no joint swelling, no joint warmth, no redness over joints, no joint deformities, no joint instability,  no crepitation, and no muscle atrophy.   Pulses:  R and L carotid,radial,femoral,dorsalis pedis and posterior tibial pulses are full and equal bilaterally Extremities:  No clubbing, cyanosis, edema, or deformity noted with normal full range of motion of all joints.   Neurologic:  No cranial nerve deficits noted. Station and gait are normal. Plantar reflexes are down-going bilaterally. DTRs are symmetrical throughout. Sensory, motor and coordinative functions appear intact. Skin:  turgor normal, color normal, no rashes, no suspicious lesions, no ecchymoses, no petechiae, no purpura, no ulcerations, and no edema.   Cervical Nodes:  no anterior cervical adenopathy and no posterior cervical adenopathy.   Psych:  Cognition and judgment appear intact. Alert and cooperative with normal attention span and concentration. 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 Take 1 tablet by mouth once a day  BP today: 120/64 Prior BP: 124/76 (08/15/2009)  Prior 10 Yr Risk Heart Disease: Not enough information (12/29/2007)  Labs Reviewed: K+: 4.3 (07/03/2009) Creat: : 0.7 (07/03/2009)     Problem # 4:  TOBACCO USE (ICD-305.1) Assessment: Unchanged  Encouraged smoking cessation and discussed different methods for smoking cessation.   Complete Medication List: 1)  Alprazolam 0.5 Mg Tabs (Alprazolam) .... Take 1 tablet by mouth two times a day as needed for anxiety 2)  Vicodin Es 7.5-750 Mg Tabs (Hydrocodone-acetaminophen) .... 3-4 once daily 3)  Temazepam 30 Mg Caps (Temazepam) .... Take 1 tab by mouth  at bedtime 4)  Lisinopril 20 Mg Tabs (Lisinopril) .... Take 1 tablet by mouth once a day 5)  Celebrex 200 Mg Caps (Celecoxib) .... One by mouth once daily as needed for pain  Patient Instructions: 1)  Please schedule a follow-up appointment in 2 months. 2)  Tobacco is very bad for your health and your loved ones! You Should stop smoking!. 3)  Stop Smoking Tips: Choose a Quit date. Cut down before the Quit date. decide what you will do as a substitute when you feel the urge to smoke(gum,toothpick,exercise). 4)  It is important that you exercise regularly at least 20 minutes 5 times a week. If you develop chest pain, have severe difficulty breathing, or feel very tired , stop exercising immediately and seek medical attention. 5)  Check your Blood Pressure regularly. If it is above 140/90: you should make an appointment. 6)  Most patients (90%) with low back pain will improve with time (2-6 weeks). Keep active but avoid activities that are painful. Apply moist heat and/or ice to lower back several times a day. Prescriptions: CELEBREX 200 MG CAPS (CELECOXIB) One by mouth once daily as needed for pain  #30 x 0   Entered and Authorized by:   Etta Grandchild MD   Signed by:   Etta Grandchild MD on 09/04/2009   Method used:   Samples Given   RxID:   [id number]

## 2010-04-30 NOTE — Progress Notes (Signed)
Summary: TO ER  Phone Note Call from Patient   Summary of Call: Pt c/o severe lower abd pain. Pain started this am but has suddenly become severe and she is having touble standing up straight and walking. No fever but also c/o increasing back pain. I advised patient to go to the ER, she agreed.  Initial call taken by: Lamar Sprinkles, CMA,  January 16, 2010 1:24 PM

## 2010-04-30 NOTE — Progress Notes (Signed)
Summary: Results  Phone Note Call from Patient Call back at Home Phone [phone]   Summary of Call: Patient is requesting results of MRI.  Initial call taken by: Lamar Sprinkles, CMA,  September 13, 2009 8:37 AM  Follow-up for Phone Call        it looks a little better then before Follow-up by: Etta Grandchild MD,  September 13, 2009 8:55 AM  Additional Follow-up for Phone Call Additional follow up Details #1::        Left detailed vm on cell # in HIPPA form Additional Follow-up by: Lamar Sprinkles, CMA,  September 13, 2009 2:53 PM

## 2010-04-30 NOTE — Progress Notes (Signed)
Summary: REFILL - Yetta Barre  Phone Note Refill Request Message from:  Pharmacy  Refills Requested: Medication #1:  VICODIN ES 7.5-750 MG TABS 3-4 once daily  Medication #2:  TEMAZEPAM 30 MG CAPS Take 1 tab by mouth at bedtime Initial call taken by: Lamar Sprinkles, CMA,  October 04, 2009 3:13 PM  Follow-up for Phone Call        done hardcopy to [facility] side B - dahlia  Follow-up by: Corwin Levins MD,  October 04, 2009 3:32 PM  Additional Follow-up for Phone Call Additional follow up Details #1::        Rx faxed to pharmacy Additional Follow-up by: Margaret Pyle, CMA,  October 04, 2009 3:35 PM    New/Updated Medications: VICODIN ES 7.5-750 MG TABS (HYDROCODONE-ACETAMINOPHEN) 3-4 once daily TEMAZEPAM 30 MG CAPS (TEMAZEPAM) Take 1 tab by mouth at bedtime Prescriptions: TEMAZEPAM 30 MG CAPS (TEMAZEPAM) Take 1 tab by mouth at bedtime  #30 x 1   Entered and Authorized by:   Corwin Levins MD   Signed by:   Corwin Levins MD on 10/04/2009   Method used:   Print then Give to Patient   RxID:   [id number] VICODIN ES 7.5-750 MG TABS (HYDROCODONE-ACETAMINOPHEN) 3-4 once daily  #120 x 1   Entered and Authorized by:   Corwin Levins MD   Signed by:   Corwin Levins MD on 10/04/2009   Method used:   Print then Give to Patient   RxID:   [id number]

## 2010-04-30 NOTE — Assessment & Plan Note (Signed)
Summary: headaches/cd   Vital Signs:  Patient profile:   50 year old female Height:      59 inches Weight:      121 pounds BMI:     24.53 O2 Sat:      96 % on Room air Temp:     98.5 degrees F oral Pulse rate:   83 / minute Pulse rhythm:   regular Resp:     16 per minute BP sitting:   132 / 70  (left arm) Cuff size:   regular  Vitals Entered By: Rock Nephew CMA (December 26, 2009 8:27 AM)  O2 Flow:  Room air  Primary Care Traveon Louro:  Etta Grandchild MD  CC:  Headaches.  History of Present Illness:  Headaches      This is a 50 year old woman who presents with Headaches.  The symptoms began 2 weeks ago.  On a scale of 1 to 10, the intensity is described as a 4.  The patient reports nausea, photophobia, and phonophobia, but denies vomiting, sweats, tearing of eyes, nasal congestion, sinus pain, and sinus pressure.  The headache is described as constant and dull.  The location of the pain is first on right and then bilateral and bitemporal.  The patient denies the following high-risk features: fever, neck pain/stiffness, vision loss or change, focal weakness, altered mental status, rash, trauma, pain worse with exertion, new type of headache, age >50 years, immunosuppression, concomitant infection, and anticoagulation use.  The headaches are precipitated by stress.  Prior treatment has included a NSAID, acetaminophen, and a narcotic.    Preventive Screening-Counseling & Management  Alcohol-Tobacco     Alcohol drinks/day: 0     Smoking Status: current     Smoking Cessation Counseling: yes     Smoke Cessation Stage: contemplative     Packs/Day: 0.5     Cans of tobacco/week: no     Passive Smoke Exposure: yes     Tobacco Counseling: to quit use of tobacco products  Hep-HIV-STD-Contraception     Hepatitis Risk: no risk noted     HIV Risk: no     STD Risk: no risk noted  Clinical Review Panels:  Prevention   Last Mammogram:  normal (07/18/2009)   Last Pap Smear:  NEGATIVE  FOR INTRAEPITHELIAL LESIONS OR MALIGNANCY. (10/17/2009)  Immunizations   Last Tetanus Booster:  Td (01/08/2007)  Lipid Management   Cholesterol:  249 (10/17/2009)   HDL (good cholesterol):  47.50 (10/17/2009)  Diabetes Management   Creatinine:  0.7 (10/17/2009)  CBC   WBC:  10.5 (10/17/2009)   RBC:  3.99 (10/17/2009)   Hgb:  12.8 (10/17/2009)   Hct:  36.9 (10/17/2009)   Platelets:  268.0 (10/17/2009)   MCV  92.5 (10/17/2009)   MCHC  34.7 (10/17/2009)   RDW  13.1 (10/17/2009)   PMN:  69.1 (10/17/2009)   Lymphs:  24.6 (10/17/2009)   Monos:  3.6 (10/17/2009)   Eosinophils:  2.3 (10/17/2009)   Basophil:  0.4 (10/17/2009)  Complete Metabolic Panel   Glucose:  88 (10/17/2009)   Sodium:  137 (10/17/2009)   Potassium:  4.4 (10/17/2009)   Chloride:  103 (10/17/2009)   CO2:  27 (10/17/2009)   BUN:  18 (10/17/2009)   Creatinine:  0.7 (10/17/2009)   Albumin:  4.5 (10/17/2009)   Total Protein:  6.8 (10/17/2009)   Calcium:  9.6 (10/17/2009)   Total Bili:  0.4 (10/17/2009)   Alk Phos:  81 (10/17/2009)   SGPT (ALT):  19 (  10/17/2009)   SGOT (AST):  19 (10/17/2009)   Medications Prior to Update: 1)  Alprazolam 0.5 Mg Tabs (Alprazolam) .... Take 1 Tablet By Mouth Two Times A Day As Needed For Anxiety 2)  Vicodin Es 7.5-750 Mg Tabs (Hydrocodone-Acetaminophen) .... 3-4 Once Daily 3)  Temazepam 30 Mg Caps (Temazepam) .... Take 1 Tab By Mouth At Bedtime 4)  Lisinopril 20 Mg Tabs (Lisinopril) .... Take 1 Tablet By Mouth Once A Day 5)  Celebrex 200 Mg Caps (Celecoxib) .... One By Mouth Once Daily As Needed For Pain 6)  Moviprep 100 Gm  Solr (Peg-Kcl-Nacl-Nasulf-Na Asc-C) .... As Per Prep Instructions.  Current Medications (verified): 1)  Alprazolam 0.5 Mg Tabs (Alprazolam) .... Take 1 Tablet By Mouth Two Times A Day As Needed For Anxiety 2)  Vicodin Es 7.5-750 Mg Tabs (Hydrocodone-Acetaminophen) .... 3-4 Once Daily 3)  Temazepam 30 Mg Caps (Temazepam) .... Take 1 Tab By Mouth At  Bedtime 4)  Lisinopril 20 Mg Tabs (Lisinopril) .... Take 1 Tablet By Mouth Once A Day 5)  Celebrex 200 Mg Caps (Celecoxib) .... One By Mouth Once Daily As Needed For Pain 6)  Moviprep 100 Gm  Solr (Peg-Kcl-Nacl-Nasulf-Na Asc-C) .... As Per Prep Instructions.  Allergies (verified): 1)  ! Penicillin 2)  ! Erythromycin 3)  ! 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 adopted  Social History: Reviewed history from 07/20/2008 and no changes required. 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  The patient denies anorexia, fever, weight loss, weight gain, vision loss, chest pain, syncope, dyspnea on exertion, peripheral edema, prolonged cough, hemoptysis, abdominal pain, hematuria, suspicious skin lesions, difficulty walking, depression, enlarged lymph nodes, and angioedema.   Neuro:  Denies difficulty with concentration, disturbances in coordination, falling down, inability to speak, memory loss, numbness, seizures, sensation of room spinning,  tingling, tremors, visual disturbances, and weakness.  Physical Exam  General:  alert, well-developed, well-nourished, well-hydrated, appropriate dress, normal appearance, healthy-appearing, cooperative to examination, and good hygiene.   Head:  normocephalic, atraumatic, no abnormalities observed, no abnormalities palpated, and no alopecia.   Eyes:  vision grossly intact, pupils equal, pupils round, pupils reactive to light, pupils react to accomodation, no injection, no optic disk abnormalities, no retinal abnormalitiies, and no nystagmus.   Ears:  R ear normal and L ear normal.   Mouth:  Oral mucosa and oropharynx without lesions or exudates.  Teeth in good repair. Neck:  supple, full ROM, no masses, no thyromegaly, no thyroid nodules or tenderness, no JVD, normal carotid upstroke, no carotid bruits, no cervical lymphadenopathy, and no neck tenderness.   Lungs:  normal respiratory effort, no intercostal retractions, no accessory muscle use, normal breath sounds, no dullness, no fremitus, no crackles, and no wheezes.  Heart:  normal rate, regular rhythm, no murmur, no gallop, no rub, and no JVD.   Abdomen:  soft, non-tender, normal bowel sounds, no distention, no masses, no guarding, no rigidity, no rebound tenderness, no abdominal hernia, no inguinal hernia, no hepatomegaly, and no splenomegaly.   Msk:  No deformity or scoliosis noted of thoracic or lumbar spine.   Pulses:  R and L carotid,radial,femoral,dorsalis pedis and posterior tibial pulses are full and equal bilaterally Extremities:  No clubbing, cyanosis, edema, or deformity noted with normal full range of motion of all joints.   Neurologic:  alert & oriented X3, cranial nerves II-XII intact, strength normal in all extremities, sensation intact to light touch, sensation intact to pinprick, gait normal, DTRs symmetrical and normal, finger-to-nose normal, heel-to-shin normal, toes down bilaterally on Babinski, and Romberg negative.      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 3-4 once daily    Celebrex 200 Mg Caps (Celecoxib) ..... One by mouth once daily as needed for pain  Orders: Admin of Therapeutic Inj  intramuscular or subcutaneous (84696) Depo- Medrol 80mg  (J1040) Depo- Medrol 40mg  (J1030)  Problem # 2:  TOBACCO USE (ICD-305.1) Assessment: Unchanged  Encouraged smoking cessation and discussed different methods for smoking cessation.   Orders: Tobacco use cessation intermediate 3-10 minutes (29528)  Problem # 3:  HYPERTENSION (ICD-401.9) Assessment: Unchanged  Her updated medication list for this problem includes:    Lisinopril 20 Mg Tabs (Lisinopril) .Marland Kitchen... Take 1 tablet by mouth once a day  BP today: 132/70 Prior BP: 110/70 (12/05/2009)  Prior 10 Yr Risk Heart Disease: Not enough information (12/29/2007)  Labs Reviewed: K+: 4.4 (10/17/2009) Creat: : 0.7 (10/17/2009)   Chol: 249 (10/17/2009)   HDL: 47.50 (10/17/2009)   TG: 248.0 (10/17/2009)  Orders: Tobacco use cessation intermediate 3-10 minutes (99406)  Complete Medication List: 1)  Alprazolam 0.5 Mg Tabs (Alprazolam) .... Take 1 tablet by mouth two times a day as needed for anxiety 2)  Vicodin Es 7.5-750 Mg Tabs (Hydrocodone-acetaminophen) .... 3-4 once daily 3)  Temazepam 30 Mg Caps (Temazepam) .... Take 1 tab by mouth at bedtime 4)  Lisinopril 20 Mg Tabs (Lisinopril) .... Take 1 tablet by mouth once a day 5)  Celebrex 200 Mg Caps (Celecoxib) .... One by mouth once daily as needed for pain 6)  Moviprep 100 Gm Solr (Peg-kcl-nacl-nasulf-na asc-c) .... As per prep instructions.  Patient Instructions: 1)  Please schedule a follow-up appointment in 2 weeks. 2)  Tobacco is very bad for your health and your  loved ones! You Should stop smoking!. 3)  Stop Smoking Tips: Choose a Quit date. Cut down before the Quit date. decide what you will do as a substitute when you feel the urge to smoke(gum,toothpick,exercise). 4)  It is important that you exercise regularly at least 20 minutes 5 times a week. If you develop chest pain, have severe difficulty breathing, or feel very tired , stop exercising immediately and seek medical attention. 5)  You need to lose weight. Consider a lower calorie diet and regular exercise.  6)  Check your Blood Pressure regularly. If it is above 140/90: you should make an appointment.  Preventive Care Screening  Pap Smear:    Date:  09/28/2009    Results:  normal   Mammogram:  Date:  07/18/2009    Results:  normal     Not Administered:    Influenza Vaccine not given due to: declined    Medication Administration  Injection # 1:    Medication: Depo- Medrol 40mg     Diagnosis: COMMON MIGRAINE (ICD-346.10)    Route: IM    Site: R deltoid    Exp Date: 06/2012    Lot #: [id number]    Mfr: pfizer    Patient tolerated injection without complications    Given by: Rock Nephew CMA (December 26, 2009 11:51 AM)  Injection # 2:    Medication: Depo- Medrol 80mg     Diagnosis: COMMON MIGRAINE (ICD-346.10)    Route: IM    Site: R deltoid    Exp Date: 06/2012    Lot #: [id number]    Mfr: pfizer    Patient tolerated injection without complications    Given by: Rock Nephew CMA (December 26, 2009 11:51 AM)  Orders Added: 1)  Tobacco use cessation intermediate 3-10 minutes [99406] 2)  Est. Patient Level IV [04540] 3)  Admin of Therapeutic Inj  intramuscular or subcutaneous [96372] 4)  Depo- Medrol 80mg  [J1040] 5)  Depo- Medrol 40mg  [J1030]

## 2010-04-30 NOTE — Assessment & Plan Note (Signed)
Summary: back pain/look at elbow per pt/cd   Vital Signs:  Patient profile:   50 year old female Height:      59 inches Weight:      110 pounds BMI:     22.30 O2 Sat:      98 % on Room air Temp:     98.6 degrees F oral Pulse rate:   71 / minute Pulse rhythm:   regular Resp:     16 per minute BP sitting:   124 / 76  (left arm) Cuff size:   large  O2 Flow:  Room air CC: follow-up visit/ ab, Hypertension Management Is Patient Diabetic? No Pain Assessment Patient in pain? yes     Location: lower back Intensity: 3 Type: aching   Primary Care Provider:  Etta Grandchild MD  CC:  follow-up visit/ ab and Hypertension Management.  History of Present Illness: She returns for f/up and complains of an exacerbation of LBP since she has been driving to [place]. [place] for a new job. The pain is a non-radiating aching in her lower back and buttocks. She has no N/W/T in her extremities. 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  Hypertension History:      She denies headache, chest pain, palpitations, dyspnea with exertion, orthopnea, PND, peripheral edema, visual symptoms, neurologic problems, syncope, and side effects from treatment.  She notes no problems with any antihypertensive medication side effects.        Positive major cardiovascular risk factors include hyperlipidemia, hypertension, and current tobacco user.  Negative major cardiovascular risk factors include female age less than 48 years old, no history of diabetes, and negative family history for ischemic heart disease.        Further assessment for target organ damage reveals no history of ASHD, cardiac end-organ damage (CHF/LVH), stroke/TIA, peripheral vascular disease, renal insufficiency, or hypertensive retinopathy.     Preventive Screening-Counseling & Management  Alcohol-Tobacco     Smoking Cessation Counseling: yes  Current  Medications (verified): 1)  Alprazolam 0.5 Mg Tabs (Alprazolam) .... Take 1 Tablet By Mouth Two Times A Day As Needed For Anxiety 2)  Vicodin Es 7.5-750 Mg Tabs (Hydrocodone-Acetaminophen) .... 3-4 Once Daily 3)  Temazepam 30 Mg Caps (Temazepam) .... Take 1 Tab By Mouth At Bedtime 4)  Lisinopril 20 Mg Tabs (Lisinopril) .... Take 1 Tablet By Mouth Once A Day  Allergies: 1)  ! Penicillin 2)  ! Erythromycin 3)  ! Codeine  Past History:  Past Medical History: Reviewed history from 12/29/2007 and no changes required. Cervical cancer, hx of Hyperlipidemia Hypertension  Past Surgical History: Reviewed history from 12/29/2007 and no changes required. Cholecystectomy Inguinal herniorrhaphy Hysterectomy Oophorectomy  Family History: Reviewed history from 12/29/2007 and no changes required. adopted  Social History: Reviewed history from 07/20/2008 and no changes required. 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   MS:  Complains of joint pain, low back pain, and stiffness; denies joint redness, joint swelling, loss of strength, mid back pain, muscle aches, muscle, cramps, muscle weakness, and thoracic pain. Neuro:  Denies brief paralysis, disturbances in coordination, numbness,  poor balance, tingling, tremors, and weakness.  Physical Exam  General:  alert, well-developed, well-nourished, well-hydrated, and appropriate dress.   Head:  normocephalic, atraumatic, no abnormalities observed, and no abnormalities palpated.   Mouth:  good dentition, no exudates, no posterior lymphoid hypertrophy, no  postnasal drip, no pharyngeal crowing, no lesions, no erosions, no tongue abnormalities, no leukoplakia, no petechiae, pharyngeal erythema, and edentulous.   Neck:  supple, full ROM, no masses, no thyromegaly, no JVD, no carotid bruits, and no cervical lymphadenopathy.   Lungs:  normal respiratory effort, no intercostal retractions, no accessory muscle use, normal breath sounds, no dullness, no fremitus, no crackles, and no wheezes.   Heart:  normal rate, regular rhythm, no murmur, no gallop, no rub, and no JVD.   Abdomen:  soft, non-tender, normal bowel sounds, no distention, no masses, no guarding, no rigidity, no rebound tenderness, no abdominal hernia, no inguinal hernia, no hepatomegaly, and no splenomegaly.   Msk:  normal ROM, no joint tenderness, no joint swelling, no joint warmth, no redness over joints, no joint deformities, no joint instability, no crepitation, and no muscle atrophy.   Pulses:  R and L carotid,radial,femoral,dorsalis pedis and posterior tibial pulses are full and equal bilaterally Extremities:  No clubbing, cyanosis, edema, or deformity noted with normal full range of motion of all joints.   Neurologic:  No cranial nerve deficits noted. Station and gait are normal. Plantar reflexes are down-going bilaterally. DTRs are symmetrical throughout. Sensory, motor and coordinative functions appear intact. Skin:  turgor normal, color normal, no rashes, no suspicious lesions, no ecchymoses, no petechiae, no purpura, no ulcerations, and no edema.   Cervical Nodes:  no anterior cervical adenopathy and no posterior cervical adenopathy.   Axillary Nodes:  no R axillary adenopathy and no L axillary adenopathy.     Detailed Back/Spine Exam  General:    Well-developed, well-nourished, in no acute distress; alert and oriented x 3.    Gait:    Normal heel-toe gait pattern bilaterally.    Lumbosacral Exam:  Inspection-deformity:    Normal Palpation-spinal tenderness:  Normal Range of  Motion:    Forward Flexion:   75 degrees    Hyperextension:   30 degrees    Right Lateral Bend:   35 degrees    Left Lateral Bend:   35 degrees Squatting:  normal Lying Straight Leg Raise:    Right:  negative    Left:  negative Sitting Straight Leg Raise:    Right:  negative    Left:  negative Reverse Straight Leg Raise:    Right:  negative    Left:  negative Contralateral Straight Leg Raise:    Right:  negative    Left:  negative Sciatic Notch:    There is no sciatic notch tenderness. Toe Walking:    Right:  normal    Left:  normal Heel Walking:    Right:  normal    Left:  normal   Impression & Recommendations:  Problem # 1:  BACK PAIN (ICD-724.5) Assessment New  Her updated medication list for this problem includes:    Vicodin Es 7.5-750 Mg Tabs (Hydrocodone-acetaminophen) .Marland Kitchen... 3-4 once daily    Celebrex 200 Mg Caps (Celecoxib) ..... One by mouth once daily as needed for pain  Orders: Tobacco use cessation intermediate 3-10 minutes (16109)  Problem # 2:  TOBACCO USE (ICD-305.1) Assessment: Unchanged  Encouraged smoking cessation and discussed different methods for smoking cessation.   Orders: Tobacco use cessation intermediate 3-10 minutes (60454)  Problem # 3:  HYPERTENSION (ICD-401.9) Assessment: Improved  Her updated medication list for this problem includes:    Lisinopril 20 Mg Tabs (Lisinopril) .Marland Kitchen... Take 1 tablet by mouth once a day  BP today: 124/76 Prior BP: 118/66 (07/03/2009)  Prior 10 Yr Risk Heart Disease: Not enough information (12/29/2007)  Labs Reviewed: K+: 4.3 (07/03/2009) Creat: : 0.7 (07/03/2009)     Orders: Tobacco use cessation intermediate 3-10 minutes (66440)  Problem # 4:  ELBOW PAIN, BILATERAL (ICD-719.42) Assessment: New this sounds like overuse syndrome vs. epicondylitis, will start nsaids and follow  Complete Medication List: 1)  Alprazolam 0.5 Mg Tabs (Alprazolam) .... Take 1 tablet by mouth two times a day as  needed for anxiety 2)  Vicodin Es 7.5-750 Mg Tabs (Hydrocodone-acetaminophen) .... 3-4 once daily 3)  Temazepam 30 Mg Caps (Temazepam) .... Take 1 tab by mouth at bedtime 4)  Lisinopril 20 Mg Tabs (Lisinopril) .... Take 1 tablet by mouth once a day 5)  Celebrex 200 Mg Caps (Celecoxib) .... One by mouth once daily as needed for pain  Hypertension Assessment/Plan:      The patient's hypertensive risk group is category B: At least one risk factor (excluding diabetes) with no target organ damage.  Today's blood pressure is 124/76.  Her blood pressure goal is < 140/90.  Patient Instructions: 1)  Please schedule a follow-up appointment in 1 month. 2)  Tobacco is very bad for your health and your loved ones! You Should stop smoking!. 3)  Stop Smoking Tips: Choose a Quit date. Cut down before the Quit date. decide what you will do as a substitute when you feel the urge to smoke(gum,toothpick,exercise). 4)  Check your Blood Pressure regularly. If it is above 140/90: you should make an appointment. 5)  You may move around but avoid painful motions. Apply ice to sore area for 20 minutes 3-4 times a day for 2-3 days. 6)  Most patients (90%) with low back pain will improve with time (2-6 weeks). Keep active but avoid activities that are painful. Apply moist heat and/or ice to lower back several times a day. Prescriptions: CELEBREX 200 MG CAPS (CELECOXIB) One by mouth once daily as needed for pain  #27 x 0   Entered and Authorized by:   Etta Grandchild MD   Signed by:   Etta Grandchild MD on 08/15/2009   Method used:   Samples Given   RxID:   [id number]

## 2010-04-30 NOTE — Progress Notes (Signed)
Summary: RESULTS  Phone Note Call from Patient Call back at [phone]   Summary of Call: Patient is requesting results of last labs.  Initial call taken by: Lamar Sprinkles, CMA,  July 10, 2009 11:46 AM  Follow-up for Phone Call        all normal Follow-up by: Etta Grandchild MD,  July 10, 2009 11:48 AM  Additional Follow-up for Phone Call Additional follow up Details #1::        Pt informed  Additional Follow-up by: Lamar Sprinkles, CMA,  July 10, 2009 1:41 PM

## 2010-04-30 NOTE — Assessment & Plan Note (Signed)
Summary: not feeling well lately/#/cd   Vital Signs:  Patient profile:   50 year old female Height:      59 inches Weight:      112 pounds BMI:     22.70 O2 Sat:      98 % on Room air Temp:     97.8 degrees F oral Pulse rate:   94 / minute Pulse rhythm:   regular Resp:     16 per minute BP sitting:   118 / 66  (left arm) Cuff size:   large  Vitals Entered By: Rock Nephew CMA (July 03, 2009 9:39 AM)  O2 Flow:  Room air CC: discuss recent stress and pain, Abdominal pain, Depressive symptoms, Preventive Care Is Patient Diabetic? No   Primary Care Provider:  Etta Grandchild MD  CC:  discuss recent stress and pain, Abdominal pain, Depressive symptoms, and Preventive Care.  History of Present Illness:  Abdominal Pain      This is a 50 year old woman who presents with Abdominal pain.  The symptoms began 2 weeks ago.  The patient denies nausea, vomiting, diarrhea, constipation, melena, hematochezia, anorexia, and hematemesis.  The location of the pain is diffuse.  The pain is described as intermittent and dull.  Associated symptoms include weight loss.  The patient denies the following symptoms: fever, dysuria, chest pain, jaundice, dark urine, and vaginal bleeding.  The pain is better with rest.    Depressive Symptoms      The patient also presents with Depressive symptoms.  The symptoms began 4-8 weeks ago.  The severity is described as mild.  The patient reports depressed mood, loss of interest/pleasure, and significant weight loss, but denies significant weight gain, insomnia, hypersomnia, psychomotor agitation, and psychomotor retardation.  The patient also reports fatigue or loss of energy and diminished concentration.  The patient denies indecisiveness, thoughts of death, thoughts of suicide, suicidal intent, and suicidal plans.  The patient reports the following psychosocial stressors: major life changes.  Patient's past history includes depression.  The patient denies  abnormally elevated mood, abnormally irritable mood, decreased need for sleep, increased talkativeness, distractibility, flight of ideas, increased goal-directed activity, and inflated self-esteem/ grandiosity.    Preventive Screening-Counseling & Management  Alcohol-Tobacco     Alcohol drinks/day: 0     Smoking Status: current     Smoking Cessation Counseling: yes     Smoke Cessation Stage: contemplative     Packs/Day: 0.5  Hep-HIV-STD-Contraception     Hepatitis Risk: no risk noted     HIV Risk: no     STD Risk: no risk noted      Sexual History:  currently monogamous.        Drug Use:  never.        Blood Transfusions:  no.    Medications Prior to Update: 1)  Alprazolam 0.5 Mg Tabs (Alprazolam) .... Take 1 Tablet By Mouth Two Times A Day As Needed For Anxiety 2)  Vicodin Es 7.5-750 Mg Tabs (Hydrocodone-Acetaminophen) .... 3-4 Once Daily 3)  Temazepam 30 Mg Caps (Temazepam) .... Take 1 Tab By Mouth At Bedtime 4)  Lisinopril 20 Mg Tabs (Lisinopril) .... Take 1 Tablet By Mouth Once A Day  Current Medications (verified): 1)  Alprazolam 0.5 Mg Tabs (Alprazolam) .... Take 1 Tablet By Mouth Two Times A Day As Needed For Anxiety 2)  Vicodin Es 7.5-750 Mg Tabs (Hydrocodone-Acetaminophen) .... 3-4 Once Daily 3)  Temazepam 30 Mg Caps (Temazepam) .... Take 1  Tab By Mouth At Bedtime 4)  Lisinopril 20 Mg Tabs (Lisinopril) .... Take 1 Tablet By Mouth Once A Day 5)  Cymbalta 30 Mg Cpep (Duloxetine Hcl) .... One By Mouth Once Daily For Depression  Allergies (verified): 1)  ! Penicillin 2)  ! Erythromycin 3)  ! Codeine  Past History:  Past Medical History: Reviewed history from 12/29/2007 and no changes required. Cervical cancer, hx of Hyperlipidemia Hypertension  Past Surgical History: Reviewed history from 12/29/2007 and no changes required. Cholecystectomy Inguinal herniorrhaphy Hysterectomy Oophorectomy  Family History: Reviewed history from 12/29/2007 and no changes  required. adopted  Social History: Reviewed history from 07/20/2008 and no changes required. 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  The patient denies anorexia, fever, chest pain, syncope, dyspnea on exertion, peripheral edema, prolonged cough, headaches, hemoptysis, melena, hematochezia, severe indigestion/heartburn, hematuria, suspicious skin lesions, angioedema, and breast masses.    Physical Exam  General:  alert, well-developed, well-nourished, well-hydrated, and appropriate dress.   Head:  normocephalic, atraumatic, no abnormalities observed, and no abnormalities palpated.   Eyes:  vision grossly intact, pupils equal, pupils round, and pupils reactive to light.   Mouth:  good dentition, no exudates, no posterior lymphoid hypertrophy, no postnasal drip, no pharyngeal crowing, no lesions, no erosions, no tongue abnormalities, no leukoplakia, no petechiae, pharyngeal erythema, and edentulous.   Neck:  supple, full ROM, no masses, no thyromegaly, no JVD, no carotid bruits, and no cervical lymphadenopathy.   Lungs:  normal respiratory effort, no intercostal retractions, no accessory muscle use, normal breath sounds, no dullness, no fremitus, no crackles, and no wheezes.   Heart:  normal rate, regular rhythm, no murmur, no gallop, no rub, and no JVD.   Abdomen:  soft, non-tender, normal bowel sounds, no distention, no masses, no guarding, no rigidity, no rebound tenderness, no abdominal hernia, no inguinal hernia, no hepatomegaly, and no splenomegaly.   Rectal:  No external abnormalities noted. Normal sphincter tone. No rectal masses or tenderness. heme negative stool. Msk:   normal ROM, no joint tenderness, no joint swelling, no joint warmth, and no redness over joints.   Pulses:  R and L carotid,radial,femoral,dorsalis pedis and posterior tibial pulses are full and equal bilaterally Extremities:  No clubbing, cyanosis, edema, or deformity noted with normal full range of motion of all joints.   Neurologic:  No cranial nerve deficits noted. Station and gait are normal. Plantar reflexes are down-going bilaterally. DTRs are symmetrical throughout. Sensory, motor and coordinative functions appear intact. Skin:  Intact without suspicious lesions or rashes Cervical Nodes:  no posterior cervical adenopathy, R anterior LN tender, and L anterior LN tender.   Axillary Nodes:  no R axillary adenopathy and no L axillary adenopathy.   Inguinal Nodes:  no R inguinal adenopathy and no L inguinal adenopathy.   Psych:  Cognition and judgment appear intact. Alert and cooperative with normal attention span and concentration. No apparent delusions, illusions, hallucinations   Impression & Recommendations:  Problem # 1:  ROUTINE GENERAL MEDICAL EXAM@[email or web address]  CARE FACL (ICD-V70.0) Assessment New  Orders: Radiology Referral (Radiology) Hemoccult Guaiac-1 spec.(in office) (82270)  Problem # 2:  ABDOMINAL PAIN, GENERALIZED (ICD-789.07) Assessment: New I think this may be a  s/s of depression but will look for organic causes Orders: Venipuncture (16109) TLB-BMP (Basic Metabolic Panel-BMET) (80048-METABOL) TLB-CBC Platelet - w/Differential (85025-CBCD) TLB-Hepatic/Liver Function Pnl (80076-HEPATIC) TLB-TSH (Thyroid Stimulating Hormone) (84443-TSH) TLB-Amylase (82150-AMYL) TLB-Lipase (83690-LIPASE) TLB-Udip w/ Micro (81001-URINE) Hemoccult Guaiac-1 spec.(in office) (82270)  Discussed symptom control with the patient.   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 Take 1 tablet by mouth once a day  Orders: Venipuncture (91478) TLB-BMP (Basic Metabolic Panel-BMET) (80048-METABOL) TLB-CBC Platelet - w/Differential (85025-CBCD) TLB-Hepatic/Liver Function Pnl (80076-HEPATIC) TLB-TSH (Thyroid Stimulating Hormone) (84443-TSH) TLB-Amylase (82150-AMYL) TLB-Lipase (83690-LIPASE) TLB-Udip w/ Micro (81001-URINE) Tobacco use cessation intermediate 3-10 minutes (99406)  BP today: 118/66 Prior BP: 130/72 (05/23/2009)  Prior 10 Yr Risk Heart Disease: Not enough information (12/29/2007)  Labs Reviewed: Creat: : 0.8 (12/31/2007)     Problem # 5:  TOBACCO USE (ICD-305.1) Assessment: Unchanged  Encouraged smoking cessation and discussed different methods for smoking cessation.   Orders: Tobacco use cessation intermediate 3-10 minutes (99406)  Complete Medication List: 1)  Alprazolam 0.5 Mg Tabs (Alprazolam) .... Take 1 tablet by mouth two times a day as needed for anxiety 2)  Vicodin Es 7.5-750 Mg Tabs (Hydrocodone-acetaminophen) .... 3-4 once daily 3)  Temazepam 30 Mg Caps (Temazepam) .... Take 1 tab by mouth at bedtime 4)  Lisinopril 20 Mg Tabs (Lisinopril) .... Take 1 tablet by mouth once a day 5)  Cymbalta 30 Mg Cpep (Duloxetine hcl) .... One by mouth once daily for depression  Colorectal Screening:  Current Recommendations:    Hemoccult: NEG X 1 today  Mammogram Screening:    Reviewed Mammogram recommendations:  mammogram ordered  Osteoporosis Risk Assessment:  Risk Factors for  Fracture or Low Bone Density:   Race (White or Asian):     yes   Smoking status:       current  Immunization & Chemoprophylaxis:    Tetanus vaccine: Td  (01/08/2007)  Patient Instructions: 1)  Please schedule a follow-up appointment in 1 month. 2)  Tobacco is very bad for your health and your loved ones! You Should stop smoking!. 3)  Stop Smoking Tips: Choose a Quit date. Cut down before the Quit date. decide what you will do as a substitute when you feel the urge to smoke(gum,toothpick,exercise). 4)  Check your Blood Pressure regularly. If it is above 140/90: you should make an appointment. Prescriptions: CYMBALTA 30 MG CPEP (DULOXETINE HCL) One by mouth once daily for depression  #42 x 0   Entered and Authorized by:   Etta Grandchild MD   Signed by:   Etta Grandchild MD on 07/03/2009   Method used:   Samples Given   RxID:   [id number]   Appended Document: not feeling well lately/#/cd   Mammogram Screening:    Last Mammogram:  07/18/2009  Mammogram Results:    Date of Exam:  07/18/2009    Results:  Normal Bilateral  Osteoporosis Risk Assessment:  Risk Factors for Fracture or Low Bone Density:   Race (White or Asian):     yes   Smoking status:       current  Immunization &  Chemoprophylaxis:    Tetanus vaccine: Td  (01/08/2007)

## 2010-04-30 NOTE — Assessment & Plan Note (Signed)
Summary: PER PT 2 WK FU FROM 9/28--STC   Vital Signs:  Patient profile:   50 year old female Height:      59 inches Weight:      118.75 pounds BMI:     24.07 O2 Sat:      97 % on Room air Temp:     98.1 degrees F oral Pulse rate:   73 / minute Pulse rhythm:   regular Resp:     16 per minute BP sitting:   110 / 68  (left arm) Cuff size:   large  Vitals Entered By: Rock Nephew CMA (January 10, 2010 9:33 AM)  O2 Flow:  Room air CC: Patient c/o continued headache and nausea, Headaches Is Patient Diabetic? No  Does patient need assistance? Functional Status Self care Ambulation Normal   Primary Care Provider:  Etta Grandchild MD  CC:  Patient c/o continued headache and nausea and Headaches.  History of Present Illness:  Headaches      This is a 50 year old woman who presents with Headaches.  The symptoms began 2 weeks ago.  On a scale of 1 to 10, the intensity is described as a 2.  The patient reports nausea, but denies vomiting, sweats, tearing of eyes, nasal congestion, sinus pain, sinus pressure, photophobia, and phonophobia.  The headache is described as intermittent and throbbing.  The location of the pain is bitemporal.  High-risk features (red flags) include age >50 years.  The patient denies the following high-risk features: fever, neck pain/stiffness, vision loss or change, focal weakness, altered mental status, rash, trauma, pain worse with exertion, new type of headache, immunosuppression, concomitant infection, and anticoagulation use.  The headaches are precipitated by stress.  Prior treatment has included a NSAID, acetaminophen, and a narcotic.    Preventive Screening-Counseling & Management  Alcohol-Tobacco     Alcohol drinks/day: 0     Smoking Status: current     Smoking Cessation Counseling: yes     Smoke Cessation Stage: contemplative     Packs/Day: 0.5     Cans of tobacco/week: no     Passive Smoke Exposure: yes     Tobacco Counseling: to quit use of  tobacco products  Hep-HIV-STD-Contraception     Hepatitis Risk: no risk noted     HIV Risk: no     STD Risk: no risk noted      Sexual History:  currently monogamous.        Drug Use:  never.        Blood Transfusions:  no.    Clinical Review Panels:  Prevention   Last Mammogram:  normal (07/18/2009)   Last Pap Smear:  NEGATIVE FOR INTRAEPITHELIAL LESIONS OR MALIGNANCY. (10/17/2009)  Immunizations   Last Tetanus Booster:  Td (01/08/2007)  Lipid Management   Cholesterol:  249 (10/17/2009)   HDL (good cholesterol):  47.50 (10/17/2009)  Diabetes Management   Creatinine:  0.7 (10/17/2009)  CBC   WBC:  10.5 (10/17/2009)   RBC:  3.99 (10/17/2009)   Hgb:  12.8 (10/17/2009)   Hct:  36.9 (10/17/2009)   Platelets:  268.0 (10/17/2009)   MCV  92.5 (10/17/2009)   MCHC  34.7 (10/17/2009)   RDW  13.1 (10/17/2009)   PMN:  69.1 (10/17/2009)   Lymphs:  24.6 (10/17/2009)   Monos:  3.6 (10/17/2009)   Eosinophils:  2.3 (10/17/2009)   Basophil:  0.4 (10/17/2009)  Complete Metabolic Panel   Glucose:  88 (10/17/2009)   Sodium:  137 (10/17/2009)  Potassium:  4.4 (10/17/2009)   Chloride:  103 (10/17/2009)   CO2:  27 (10/17/2009)   BUN:  18 (10/17/2009)   Creatinine:  0.7 (10/17/2009)   Albumin:  4.5 (10/17/2009)   Total Protein:  6.8 (10/17/2009)   Calcium:  9.6 (10/17/2009)   Total Bili:  0.4 (10/17/2009)   Alk Phos:  81 (10/17/2009)   SGPT (ALT):  19 (10/17/2009)   SGOT (AST):  19 (10/17/2009)   Medications Prior to Update: 1)  Alprazolam 0.5 Mg Tabs (Alprazolam) .... Take 1 Tablet By Mouth Two Times A Day As Needed For Anxiety 2)  Vicodin Es 7.5-750 Mg Tabs (Hydrocodone-Acetaminophen) .... 3-4 Once Daily 3)  Temazepam 30 Mg Caps (Temazepam) .... Take 1 Tab By Mouth At Bedtime 4)  Lisinopril 20 Mg Tabs (Lisinopril) .... Take 1 Tablet By Mouth Once A Day 5)  Celebrex 200 Mg Caps (Celecoxib) .... One By Mouth Once Daily As Needed For Pain  Current Medications  (verified): 1)  Alprazolam 0.5 Mg Tabs (Alprazolam) .... Take 1 Tablet By Mouth Two Times A Day As Needed For Anxiety 2)  Vicodin Es 7.5-750 Mg Tabs (Hydrocodone-Acetaminophen) .... 3-4 Once Daily 3)  Temazepam 30 Mg Caps (Temazepam) .... Take 1 Tab By Mouth At Bedtime 4)  Lisinopril 20 Mg Tabs (Lisinopril) .... Take 1 Tablet By Mouth Once A Day 5)  Celebrex 200 Mg Caps (Celecoxib) .... One By Mouth Once Daily As Needed For Pain 6)  Ondansetron Hcl 8 Mg Tabs (Ondansetron Hcl) .... One By Mouth Q 8 Hours As Needed For Nausea  Allergies (verified): 1)  ! Penicillin 2)  ! Erythromycin 3)  ! 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 adopted  Social History: Reviewed history from 07/20/2008 and no changes required. 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    Physical  Exam  General:  alert, well-developed, well-nourished, well-hydrated, appropriate dress, normal appearance, healthy-appearing, cooperative to examination, and good hygiene.   Head:  normocephalic, atraumatic, no abnormalities observed, no abnormalities palpated, and no alopecia.   Eyes:  vision grossly intact, pupils equal, pupils round, pupils reactive to light, pupils react to accomodation, corneas and lenses clear, no retinal abnormalitiies, and no nystagmus.   Ears:  R ear normal and L ear normal.   Mouth:  Oral mucosa and oropharynx without lesions or exudates.  Teeth in good repair. Neck:  supple, full ROM, no masses, no thyromegaly, no thyroid nodules or tenderness, no JVD, normal carotid upstroke, no carotid bruits, no cervical lymphadenopathy, and no neck tenderness.   Lungs:  normal respiratory effort, no intercostal retractions, no accessory muscle use, normal breath sounds, no  dullness, no fremitus, no crackles, and no wheezes.   Heart:  normal rate, regular rhythm, no murmur, no gallop, no rub, and no JVD.   Abdomen:  soft, non-tender, normal bowel sounds, no distention, no masses, no guarding, no rigidity, no rebound tenderness, no abdominal hernia, no inguinal hernia, no hepatomegaly, and no splenomegaly.   Msk:  No deformity or scoliosis noted of thoracic or lumbar spine.   Pulses:  R and L carotid,radial,femoral,dorsalis pedis and posterior tibial pulses are full and equal bilaterally Extremities:  No clubbing, cyanosis, edema, or deformity noted with normal full range of motion of all joints.   Neurologic:  alert & oriented X3, cranial nerves II-XII intact, strength normal in all extremities, sensation intact to light touch, sensation intact to pinprick, gait normal, DTRs symmetrical and normal, finger-to-nose normal, heel-to-shin normal, toes down bilaterally on Babinski, and Romberg negative.   Skin:  Intact without suspicious lesions or rashes Cervical Nodes:  No lymphadenopathy  noted Psych:  Cognition and judgment appear intact. Alert and cooperative with normal attention span and concentration. 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 3-4 once daily    Celebrex 200 Mg Caps (Celecoxib) ..... One by mouth once daily as needed for pain  Orders: Neurology Referral (Neuro)  Problem # 3:  TOBACCO USE (ICD-305.1) Assessment: Unchanged  Encouraged smoking cessation and discussed different methods for smoking cessation.   Problem # 4:  HYPERTENSION (ICD-401.9) Assessment: Improved  Her updated medication list for this problem includes:    Lisinopril 20 Mg Tabs (Lisinopril) .Marland Kitchen... Take 1 tablet by mouth once a day  Orders: Venipuncture (13244) TLB-BMP (Basic Metabolic Panel-BMET) (80048-METABOL) TLB-CBC Platelet - w/Differential (85025-CBCD) TLB-Hepatic/Liver Function Pnl (80076-HEPATIC) TLB-TSH (Thyroid Stimulating Hormone) (84443-TSH) TLB-Sedimentation Rate (ESR) (85652-ESR) TLB-CRP-High Sensitivity (C-Reactive Protein) (86140-FCRP)  BP today: 110/68 Prior BP: 132/70 (12/26/2009)  Prior 10 Yr Risk Heart Disease: Not enough information (12/29/2007)  Labs Reviewed: K+: 4.4 (10/17/2009) Creat: : 0.7 (10/17/2009)   Chol: 249 (10/17/2009)   HDL: 47.50 (10/17/2009)   TG: 248.0 (10/17/2009)  Complete Medication List: 1)  Alprazolam 0.5 Mg  Tabs (Alprazolam) .... Take 1 tablet by mouth two times a day as needed for anxiety 2)  Vicodin Es 7.5-750 Mg Tabs (Hydrocodone-acetaminophen) .... 3-4 once daily 3)  Temazepam 30 Mg Caps (Temazepam) .... Take 1 tab by mouth at bedtime 4)  Lisinopril 20 Mg Tabs (Lisinopril) .... Take 1 tablet by mouth once a day 5)  Celebrex 200 Mg Caps (Celecoxib) .... One by mouth once daily as needed for pain 6)  Ondansetron Hcl 8 Mg Tabs (Ondansetron hcl) .... One by mouth q 8 hours as needed for nausea  Patient Instructions: 1)  Please schedule a follow-up appointment in 1 month. 2)  Tobacco is very bad for your health and your loved ones! You Should stop smoking!. 3)  Stop Smoking Tips: Choose a Quit date. Cut down before the Quit date. decide what you will do as a substitute when you feel the urge to smoke(gum,toothpick,exercise). 4)  Check your Blood Pressure regularly. If it is  above 130/80: you should make an appointment. Prescriptions: ONDANSETRON HCL 8 MG TABS (ONDANSETRON HCL) one by mouth q 8 hours as needed for nausea  #35 x 3   Entered and Authorized by:   Etta Grandchild MD   Signed by:   Etta Grandchild MD on 01/10/2010   Method used:   Electronically to        [facility]  [phone]* (retail)       [address]       [place], [place]  [address]       Ph: [phone] or [phone]       Fax: [phone]   RxID:   [id number]

## 2010-04-30 NOTE — Assessment & Plan Note (Signed)
Summary: Cpx/will come fasting for labs/cd   Vital Signs:  Patient profile:   50 year old female Height:      59 inches Weight:      114 pounds BMI:     23.11 O2 Sat:      96 % on Room air Temp:     97.2 degrees F oral Pulse rate:   72 / minute Pulse rhythm:   regular Resp:     16 per minute BP sitting:   118 / 70  (right arm) Cuff size:   large  Vitals Entered By: Rock Nephew CMA (October 17, 2009 10:14 AM)  O2 Flow:  Room air  Primary Care Provider:  Etta Grandchild MD   History of Present Illness: She returns for a complete physical with no comlpaints.  Preventive Screening-Counseling & Management  Alcohol-Tobacco     Smoking Cessation Counseling: yes  Allergies: 1)  ! Penicillin 2)  ! Erythromycin 3)  ! 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 adopted  Social History: Reviewed history from 07/20/2008 and no changes required. 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    Physical Exam  General:  Well-developed, well-nourished, in no acute distress; alert and oriented x 3.   Head:  normocephalic, atraumatic, no abnormalities observed, and no abnormalities palpated.   Eyes:  vision grossly intact, pupils equal, pupils round, and pupils reactive to light.   Ears:  R ear normal and L ear normal.   Nose:  External nasal examination shows no deformity or inflammation. Nasal mucosa are pink and moist without lesions or exudates. Mouth:  good dentition, no exudates, no posterior lymphoid hypertrophy, no postnasal drip, no pharyngeal crowing, no lesions, no erosions, no tongue abnormalities, no leukoplakia, no petechiae, pharyngeal erythema, and edentulous.   Neck:  supple, full ROM, no masses, no thyromegaly, no JVD, no carotid bruits, and no cervical lymphadenopathy.   Chest Wall:  No deformities, masses, or tenderness noted. Breasts:  skin/areolae normal, no masses, no abnormal thickening, no nipple discharge, no tenderness, and no adenopathy.   Lungs:  Normal respiratory effort, chest expands symmetrically. Lungs are clear to auscultation, no crackles or wheezes. Heart:  Normal rate and regular rhythm. S1 and S2 normal without gallop, murmur, click, rub or other extra sounds. Abdomen:  soft, non-tender, normal bowel sounds, no distention, no masses, no guarding, no rigidity, no rebound tenderness, no abdominal hernia, no inguinal hernia, no hepatomegaly, and no splenomegaly.   Rectal:  It feels like there is a polyp on the right rectal wall. no external  abnormalities, no hemorrhoids, normal sphincter tone, no tenderness, no fissures, no fistulae, and no perianal rash.   Genitalia:  normal introitus, no external lesions, no  vaginal discharge, mucosa pink and moist, no vaginal or cervical lesions, no vaginal atrophy, no friaility or hemorrhage, normal uterus size and position, and no adnexal masses or tenderness.   Msk:  normal ROM, no joint tenderness, no joint swelling, no joint warmth, no redness over joints, no joint deformities, no joint instability, no crepitation, and no muscle atrophy.   Pulses:  R and L carotid,radial,femoral,dorsalis pedis and posterior tibial pulses are full and equal bilaterally Extremities:  No clubbing, cyanosis, edema, or deformity noted with normal full range of motion of all joints.   Neurologic:  No cranial nerve deficits noted. Station and gait are normal. Plantar reflexes are down-going bilaterally. DTRs are symmetrical throughout. Sensory, motor and coordinative functions appear intact. Skin:  color normal, no rashes, no suspicious lesions, no ecchymoses, no petechiae, no purpura, no ulcerations, no edema, tattoo(s), and excessive tan.   Cervical Nodes:  no anterior cervical adenopathy and no posterior cervical adenopathy.   Axillary Nodes:  no R axillary adenopathy and no L axillary adenopathy.   Inguinal Nodes:  no R inguinal adenopathy and no L inguinal adenopathy.   Psych:  Cognition and judgment appear intact. Alert and cooperative with normal attention span and concentration. No apparent delusions, illusions, hallucinations   Impression & Recommendations:  Problem # 1:  ROUTINE GENERAL MEDICAL EXAM@HEALTH  CARE FACL (ICD-V70.0) Assessment Unchanged  Orders: Venipuncture (54098) TLB-Lipid Panel (80061-LIPID) TLB-CBC Platelet - w/Differential (85025-CBCD) TLB-BMP (Basic Metabolic Panel-BMET) (80048-METABOL) TLB-Hepatic/Liver Function Pnl (80076-HEPATIC) TLB-TSH (Thyroid Stimulating Hormone) (11914-NWG) Gastroenterology Referral (GI) Hemoccult Guaiac-1 spec.(in office) (82270)  Complete Medication List: 1)  Alprazolam 0.5 Mg Tabs (Alprazolam) .... Take 1 tablet by mouth  two times a day as needed for anxiety 2)  Vicodin Es 7.5-750 Mg Tabs (Hydrocodone-acetaminophen) .... 3-4 once daily 3)  Temazepam 30 Mg Caps (Temazepam) .... Take 1 tab by mouth at bedtime 4)  Lisinopril 20 Mg Tabs (Lisinopril) .... Take 1 tablet by mouth once a day 5)  Celebrex 200 Mg Caps (Celecoxib) .... One by mouth once daily as needed for pain  Colorectal Screening:  Current Recommendations:    Hemoccult: NEG X 1 today    Colonoscopy recommended: scheduled with [facility].  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 No    Reviewed PAP smear recommendations:  PAP smear done  Mammogram Screening:    Last Mammogram:  07/18/2009  Osteoporosis Risk Assessment:  Risk Factors for Fracture or Low Bone Density:   Race (White or Asian):     yes   Smoking status:       current  Immunization & Chemoprophylaxis:    Tetanus vaccine: Td  (01/08/2007)  Patient Instructions: 1)  Please schedule a follow-up appointment in 4 months. 2)  Tobacco is very bad for your health and your loved ones! You Should stop smoking!. 3)  Stop Smoking Tips: Choose a Quit date. Cut down before the Quit date. decide what you will do as a substitute when you feel the urge to smoke(gum,toothpick,exercise). 4)  Check your Blood Pressure regularly. If it is above 130/80: you should make an appointment.

## 2010-04-30 NOTE — Assessment & Plan Note (Signed)
Summary: FELL--GROIN AREA PAIN---STC--Rm 8   Vital Signs:  Patient profile:   50 year old female Height:      59 inches Weight:      120.38 pounds BMI:     24.40 O2 Sat:      97 % on Room air Temp:     98.5 degrees F oral Pulse rate:   78 / minute Pulse rhythm:   regular Resp:     16 per minute BP sitting:   110 / 70  (right arm) Cuff size:   regular  Vitals Entered By: Mervin Kung CMA Duncan Dull) (December 05, 2009 8:13 AM)  O2 Flow:  Room air CC: rM 8   Pt states she slipped yesterday and feels like she pulled a muscle in her groin. Is Patient Diabetic? No Pain Assessment Patient in pain? yes     Location: inner thighs Intensity: 5   Primary Care Provider:  Etta Grandchild MD  CC:  rM 8   Pt states she slipped yesterday and feels like she pulled a muscle in her groin.Marland Kitchen  History of Present Illness: She returns c/o that she stepped in water yesterday and strained her inner thigh muscles. She did not fall down or sustain any blunt trauma. She did not feel discomfort immediately but several hours later she noticed some aching in both inner thighs. She can walk and use her legs without any difficulty.  Preventive Screening-Counseling & Management  Alcohol-Tobacco     Alcohol drinks/day: 0     Smoking Status: current     Smoking Cessation Counseling: yes     Smoke Cessation Stage: contemplative     Packs/Day: 0.5     Cans of tobacco/week: no     Passive Smoke Exposure: yes     Tobacco Counseling: to quit use of tobacco products  Hep-HIV-STD-Contraception     Hepatitis Risk: no risk noted     HIV Risk: no     STD Risk: no risk noted  Medications Prior to Update: 1)  Alprazolam 0.5 Mg Tabs (Alprazolam) .... Take 1 Tablet By Mouth Two Times A Day As Needed For Anxiety 2)  Vicodin Es 7.5-750 Mg Tabs (Hydrocodone-Acetaminophen) .... 3-4 Once Daily 3)  Temazepam 30 Mg Caps (Temazepam) .... Take 1 Tab By Mouth At Bedtime 4)  Lisinopril 20 Mg Tabs (Lisinopril) .... Take  1 Tablet By Mouth Once A Day 5)  Celebrex 200 Mg Caps (Celecoxib) .... One By Mouth Once Daily As Needed For Pain 6)  Moviprep 100 Gm  Solr (Peg-Kcl-Nacl-Nasulf-Na Asc-C) .... As Per Prep Instructions.  Current Medications (verified): 1)  Alprazolam 0.5 Mg Tabs (Alprazolam) .... Take 1 Tablet By Mouth Two Times A Day As Needed For Anxiety 2)  Vicodin Es 7.5-750 Mg Tabs (Hydrocodone-Acetaminophen) .... 3-4 Once Daily 3)  Temazepam 30 Mg Caps (Temazepam) .... Take 1 Tab By Mouth At Bedtime 4)  Lisinopril 20 Mg Tabs (Lisinopril) .... Take 1 Tablet By Mouth Once A Day 5)  Celebrex 200 Mg Caps (Celecoxib) .... One By Mouth Once Daily As Needed For Pain 6)  Moviprep 100 Gm  Solr (Peg-Kcl-Nacl-Nasulf-Na Asc-C) .... As Per Prep Instructions.  Allergies: 1)  ! Penicillin 2)  ! Erythromycin 3)  ! 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 adopted  Social History: Reviewed history from 07/20/2008 and no changes required. 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  Physical Exam  General:  Well-developed, well-nourished, in no acute distress; alert and oriented x 3.   Mouth:  good dentition, no exudates, no posterior lymphoid hypertrophy, no postnasal drip, no pharyngeal crowing, no lesions, no erosions, no tongue abnormalities, no leukoplakia, no petechiae, pharyngeal erythema, and edentulous.   Neck:  supple, full ROM, no masses, no thyromegaly, no JVD, no carotid bruits, and no cervical lymphadenopathy.   Lungs:  Normal respiratory effort, chest expands symmetrically. Lungs are clear to auscultation, no crackles or wheezes. Heart:  Normal rate and regular rhythm. S1 and S2 normal without gallop, murmur, click, rub or other extra sounds. Abdomen:  soft, non-tender, normal bowel sounds, no distention, no masses, no guarding, no rigidity, no rebound tenderness, no abdominal hernia, no inguinal hernia, no hepatomegaly, and no splenomegaly.   Msk:  normal ROM, no joint tenderness, no joint swelling, no joint warmth, no redness over joints, no joint deformities, no joint instability, no crepitation, and no muscle atrophy.   Pulses:  R and L carotid,radial,femoral,dorsalis pedis and posterior tibial pulses are full and equal bilaterally Extremities:  No clubbing, cyanosis, edema, or deformity noted with normal full range of motion of all joints.   Neurologic:  No cranial nerve deficits noted. Station and gait are normal. Plantar reflexes are down-going bilaterally. DTRs are symmetrical throughout. Sensory, motor and coordinative functions appear intact. Skin:  Intact without suspicious lesions or rashes Cervical Nodes:  No lymphadenopathy noted Psych:  Cognition and judgment appear intact. Alert and cooperative with normal attention span and concentration. No apparent delusions, illusions,  hallucinations   Impression & Recommendations:  Problem # 1:  UNSPECIFIED SITE OF SPRAIN AND STRAIN (ICD-848.9) Assessment New continue current meds, apply warm compresses to the area, stay active, report any changes to me  Complete Medication List: 1)  Alprazolam 0.5 Mg Tabs (Alprazolam) .... Take 1 tablet by mouth two times a day as needed for anxiety 2)  Vicodin Es 7.5-750 Mg Tabs (Hydrocodone-acetaminophen) .... 3-4 once daily 3)  Temazepam 30 Mg Caps (Temazepam) .... Take 1 tab by mouth at bedtime 4)  Lisinopril 20 Mg Tabs (Lisinopril) .... Take 1 tablet by mouth once a day 5)  Celebrex 200 Mg Caps (Celecoxib) .... One by mouth once daily as needed for pain 6)  Moviprep 100 Gm Solr (Peg-kcl-nacl-nasulf-na asc-c) .... As per prep instructions.  Patient Instructions: 1)  Please schedule a follow-up appointment in 2 weeks. 2)  Tobacco is very bad for your health and your loved ones! You Should stop smoking!. 3)  Stop Smoking Tips: Choose a Quit date. Cut down before the Quit date. decide what you will do as a substitute when you feel the urge to smoke(gum,toothpick,exercise). 4)  It is important that you exercise regularly at least 20 minutes 5 times a week. If you develop chest pain, have severe difficulty breathing, or feel very tired , stop exercising immediately and seek medical attention. 5)  Take 650-1000mg  of Tylenol every 4-6 hours as needed for relief of pain or comfort of fever AVOID taking more than 4000mg   in a 24 hour period (can cause liver damage in higher doses). 6)  You may move around but avoid painful motions. Apply ice to sore area for 20 minutes 3-4 times a day for 2-3 days.  Current Allergies (reviewed today): ! PENICILLIN ! ERYTHROMYCIN ! CODEINE

## 2010-04-30 NOTE — Progress Notes (Signed)
Summary: Barbara [name]   Phone Note Call from Patient Call back at Home Phone [phone] Call back at [phone]   Summary of Call: Patient is requesting a call back. She thinks she may have pulled her groin muscle, took pain med w/no relief. Pt wants to know if she needs office visit.  Initial call taken by: Lamar Sprinkles, CMA,  December 04, 2009 11:50 [name]  Follow-up for Phone Call        Patient scheduled for tomorrow [name], declined earlier apt w/another MD. She slipped on wet carpet and feels that she may have pulled a muscle. She will take her normal pain medication and call office or go to ER w/Severe symptoms.  Follow-up by: Lamar Sprinkles, CMA,  December 04, 2009 12:38 PM

## 2010-04-30 NOTE — Progress Notes (Signed)
Summary: Back pain   Phone Note Call from Patient   Summary of Call: Pt informed regarding results of MRI. She c/o increase in pain. 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 She feels that this is the cause of increase in pain. I advised she try to stop one hour into her drive to take a break. If pain increases or does not change she will call office back. She will only have this job for another couple weeks.  Initial call taken by: Lamar Sprinkles, CMA,  September 13, 2009 4:20 PM

## 2010-04-30 NOTE — Progress Notes (Signed)
  Phone Note Refill Request   Refills Requested: Medication #1:  ALPRAZOLAM 0.5 MG TABS Take 1 tablet by mouth two times a day as needed for anxiety   Last Refilled: 05/02/2009  Pt was seen today, it this ok to refill?  Initial call taken by: Rock Nephew CMA,  September 04, 2009 11:24 AM  Follow-up for Phone Call        yes Follow-up by: Etta Grandchild MD,  September 04, 2009 11:38 AM    Prescriptions: ALPRAZOLAM 0.5 MG TABS (ALPRAZOLAM) Take 1 tablet by mouth two times a day as needed for anxiety  #60 x 6   Entered by:   Rock Nephew CMA   Authorized by:   Etta Grandchild MD   Signed by:   Rock Nephew CMA on 09/04/2009   Method used:   Telephoned to ...       [facility]  [address]  [phone]* (retail)       [address]       [place], [place]  [address]       Ph: [phone] or [phone]       Fax: [phone]   RxID:   [id number]

## 2010-04-30 NOTE — Assessment & Plan Note (Signed)
Summary: bronchitis?-lb   Vital Signs:  Patient profile:   50 year old female Height:      59 inches Weight:      113 pounds BMI:     22.91 O2 Sat:      98 % on Room air Temp:     98.7 degrees F oral Pulse rate:   78 / minute Pulse rhythm:   regular Resp:     16 per minute BP sitting:   130 / 72  (left arm) Cuff size:   large  Vitals Entered By: Rock Nephew CMA (May 23, 2009 3:37 PM)  O2 Flow:  Room air  Primary Care Provider:  Etta Grandchild MD  CC:  URI symptoms.  History of Present Illness:  URI Symptoms      This is a 50 year old woman who presents with URI symptoms.  The symptoms began 3 days ago.  The severity is described as moderate.  The patient reports nasal congestion, purulent nasal discharge, sore throat, and sick contacts, but denies dry cough, productive cough, and earache.  Associated symptoms include low-grade fever (<100.5 degrees).  The patient denies stiff neck, dyspnea, wheezing, rash, vomiting, diarrhea, use of an antipyretic, and response to antipyretic.  The patient denies headache, muscle aches, and severe fatigue.  Risk factors for Strep sinusitis include unilateral facial pain, unilateral nasal discharge, Strep exposure, tender adenopathy, and absence of cough.    Hypertension History:      She denies headache, chest pain, palpitations, dyspnea with exertion, orthopnea, peripheral edema, neurologic problems, syncope, and side effects from treatment.  She notes no problems with any antihypertensive medication side effects.        Positive major cardiovascular risk factors include hyperlipidemia, hypertension, and current tobacco user.  Negative major cardiovascular risk factors include female age less than 30 years old, no history of diabetes, and negative family history for ischemic heart disease.        Further assessment for target organ damage reveals no history of ASHD, cardiac end-organ damage (CHF/LVH), stroke/TIA, peripheral vascular  disease, renal insufficiency, or hypertensive retinopathy.     Preventive Screening-Counseling & Management  Alcohol-Tobacco     Alcohol drinks/day: 0     Smoking Status: current     Smoking Cessation Counseling: yes     Smoke Cessation Stage: ready     Packs/Day: 1     Cans of tobacco/week: no     Passive Smoke Exposure: yes  Hep-HIV-STD-Contraception     Hepatitis Risk: no risk noted     HIV Risk: no     STD Risk: no risk noted      Drug Use:  no.    Medications Prior to Update: 1)  Alprazolam 0.5 Mg Tabs (Alprazolam) .... Take 1 Tablet By Mouth Two Times A Day As Needed For Anxiety 2)  Vicodin Es 7.5-750 Mg Tabs (Hydrocodone-Acetaminophen) .... 3-4 Once Daily 3)  Temazepam 30 Mg Caps (Temazepam) .... Take 1 Tab By Mouth At Bedtime 4)  Lisinopril 20 Mg Tabs (Lisinopril) .... Take 1 Tablet By Mouth Once A Day 5)  Avelox Abc Pack 400 Mg Tabs (Moxifloxacin Hcl) .... One By Mouth Once Daily For 5 Days  Current Medications (verified): 1)  Alprazolam 0.5 Mg Tabs (Alprazolam) .... Take 1 Tablet By Mouth Two Times A Day As Needed For Anxiety 2)  Vicodin Es 7.5-750 Mg Tabs (Hydrocodone-Acetaminophen) .... 3-4 Once Daily 3)  Temazepam 30 Mg Caps (Temazepam) .... Take 1 Tab By Mouth  At Bedtime 4)  Lisinopril 20 Mg Tabs (Lisinopril) .... Take 1 Tablet By Mouth Once A Day  Allergies (verified): 1)  ! Penicillin 2)  ! Erythromycin 3)  ! Codeine  Past History:  Past Medical History: Reviewed history from 12/29/2007 and no changes required. Cervical cancer, hx of Hyperlipidemia Hypertension  Past Surgical History: Reviewed history from 12/29/2007 and no changes required. Cholecystectomy Inguinal herniorrhaphy Hysterectomy Oophorectomy  Family History: Reviewed history from 12/29/2007 and no changes required. adopted  Social History: Reviewed history from 07/20/2008 and no changes required. 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  The patient denies anorexia, weight loss, chest pain, peripheral edema, prolonged cough, headaches, hemoptysis, abdominal pain, hematuria, and suspicious skin lesions.    Physical Exam  General:  alert, well-developed, well-nourished, well-hydrated, and appropriate dress.   Head:  normocephalic, atraumatic, no abnormalities observed, and no abnormalities palpated.   Eyes:  vision grossly intact, pupils equal, pupils round, and pupils reactive to light.   Ears:  R ear normal and L ear normal.   Nose:  no mucosal friability, no active bleeding or clots, no septum abnormalities, nasal dischargemucosal pallor, mucosal edema, L maxillary sinus tenderness, and R maxillary sinus tenderness.   Mouth:  good dentition, no exudates, no posterior lymphoid hypertrophy, no postnasal drip, no pharyngeal crowing, no lesions, no erosions, no tongue abnormalities, no leukoplakia, no petechiae, pharyngeal erythema, and edentulous.   Neck:  supple, full ROM, no masses, no thyromegaly, no JVD, no carotid bruits, and no cervical lymphadenopathy.   Lungs:  normal respiratory effort, no intercostal retractions, no accessory muscle use, normal breath sounds, no dullness, no fremitus, no crackles, and no wheezes.   Heart:  normal rate, regular rhythm, no murmur, no gallop, no rub, and no JVD.   Abdomen:  soft, non-tender, normal bowel sounds, no distention, no masses, no guarding, no rigidity, no rebound tenderness, no hepatomegaly, and no splenomegaly.   Msk:  normal ROM, no joint tenderness, no joint swelling, no joint warmth, and no redness over joints.   Pulses:  R and L carotid,radial,femoral,dorsalis pedis and posterior tibial pulses are full and equal bilaterally Extremities:  No clubbing, cyanosis, edema, or deformity noted with normal full range of motion  of all joints.   Neurologic:  No cranial nerve deficits noted. Station and gait are normal. Plantar reflexes are down-going bilaterally. DTRs are symmetrical throughout. Sensory, motor and coordinative functions appear intact. Skin:  Intact without suspicious lesions or rashes Cervical Nodes:  no posterior cervical adenopathy, R anterior LN tender, and L anterior LN tender.   Axillary Nodes:  no R axillary adenopathy and no L axillary adenopathy.   Inguinal Nodes:  no R inguinal adenopathy and no L inguinal adenopathy.   Psych:  Cognition and judgment appear intact. Alert and cooperative with normal attention span and concentration. No apparent delusions, illusions, hallucinations   Impression & Recommendations:  Problem # 1:  SINUSITIS- ACUTE-NOS (ICD-461.9)  The following medications were removed from the medication list:    Avelox Abc Pack 400 Mg Tabs (Moxifloxacin hcl) ..... One by mouth once daily for 5 days Her updated medication list for this problem includes:    Avelox 400 Mg Tabs (Moxifloxacin hcl) ..... Once daily for 7 days  Instructed on treatment. Call if symptoms persist or  worsen.   Orders: Tobacco use cessation intermediate 3-10 minutes ([address]40)  Problem # 2:  TOBACCO USE (ICD-305.1) Assessment: Unchanged  Encouraged smoking cessation and discussed different methods for smoking cessation.   Orders: Tobacco use cessation intermediate 3-10 minutes (98119)  Problem # 3:  HYPERTENSION (ICD-401.9) Assessment: Unchanged  Her updated medication list for this problem includes:    Lisinopril 20 Mg Tabs (Lisinopril) .Marland Kitchen... Take 1 tablet by mouth once a day  BP today: 130/72 Prior BP: 124/72 (05/02/2009)  Prior 10 Yr Risk Heart Disease: Not enough information (12/29/2007)  Labs Reviewed: Creat: : 0.8 (12/31/2007)     Orders: Tobacco use cessation intermediate 3-10 minutes ([address]406)  Complete Medication List: 1)  Alprazolam 0.5 Mg Tabs (Alprazolam) .... Take 1 tablet  by mouth two times a day as needed for anxiety 2)  Vicodin Es 7.5-750 Mg Tabs (Hydrocodone-acetaminophen) .... 3-4 once daily 3)  Temazepam 30 Mg Caps (Temazepam) .... Take 1 tab by mouth at bedtime 4)  Lisinopril 20 Mg Tabs (Lisinopril) .... Take 1 tablet by mouth once a day 5)  Avelox 400 Mg Tabs (Moxifloxacin hcl) .... Once daily for 7 days  Hypertension Assessment/Plan:      The patient's hypertensive risk group is category B: At least one risk factor (excluding diabetes) with no target organ damage.  Today's blood pressure is 130/72.  Her blood pressure goal is < 140/90.  Patient Instructions: 1)  Please schedule a follow-up appointment in 1 month. 2)  Tobacco is very bad for your health and your loved ones! You Should stop smoking!. 3)  Stop Smoking Tips: Choose a Quit date. Cut down before the Quit date. decide what you will do as a substitute when you feel the urge to smoke(gum,toothpick,exercise). 4)  Take your antibiotic as prescribed until ALL of it is gone, but stop if you develop a rash or swelling and contact our office as soon as possible. 5)  Acute sinusitis symptoms for less than 10 days are not helped by antibiotics.Use warm moist compresses, and over the counter decongestants ( only as directed). Call if no improvement in 5-7 days, sooner if increasing pain, fever, or new symptoms. Prescriptions: AVELOX 400 MG TABS (MOXIFLOXACIN HCL) once daily for 7 days  #7 x 0   Entered and Authorized by:   Etta Grandchild MD   Signed by:   Etta Grandchild MD on 05/23/2009   Method used:   Samples Given   RxID:   [id number]

## 2010-04-30 NOTE — Progress Notes (Signed)
Summary: refill denied--vicodin  Phone Note Refill Request Message from:  Pharmacy on December 07, 2009 11:30 AM  Refills Requested: Medication #1:  VICODIN ES 7.5-750 MG TABS 3-4 once daily   Dosage confirmed as above?Dosage Confirmed   Supply Requested: #120   Last Refilled: 11/07/2009 Last seen 12/05/09  Next Appointment Scheduled: none with Dr Yetta Barre Initial call taken by: Mervin Kung CMA Duncan Dull),  December 07, 2009 11:31 AM  Follow-up for Phone Call        she got a 4 month supply on 10/04/09 so this is too early Follow-up by: Etta Grandchild MD,  December 07, 2009 11:36 AM  Additional Follow-up for Phone Call Additional follow up Details #1::        Left message on pharmacy voicemail that rx is denied. Nicki Guadalajara Fergerson CMA Duncan Dull)  December 07, 2009 11:40 AM

## 2010-04-30 NOTE — Progress Notes (Signed)
Summary: phone  Phone Note Call from Patient   Caller: Patient Summary of Call: Pt phoned office, questioning if ok to take a.m. dose of Lisinopril.  Pt told to take Lisinopril per normal schedule.  Understanding voiced Initial call taken by: Karl Bales RN,  January 23, 2010 3:20 PM

## 2010-04-30 NOTE — Progress Notes (Signed)
Summary: Cymbalta  Phone Note Call from Patient Call back at [facility] Phone [phone]   Summary of Call: Patient called stating that she took one of the 30mg  Cymbalta and it gave her nausea, HA, jittery,  and muscle tightness. Patient would like to know if the dosage is too high or what else you recommend. Please advise. Initial call taken by: Lucious Groves,  July 04, 2009 8:55 AM  Follow-up for Phone Call        stop cymbalta, change to zoloft (Rx sent) Follow-up by: Etta Grandchild MD,  July 04, 2009 8:58 AM  Additional Follow-up for Phone Call Additional follow up Details #1::        Patient notified. Additional Follow-up by: Lucious Groves,  July 04, 2009 4:32 PM    New/Updated Medications: SERTRALINE HCL 25 MG TABS (SERTRALINE HCL) One by mouth once daily Prescriptions: SERTRALINE HCL 25 MG TABS (SERTRALINE HCL) One by mouth once daily  #30 x 11   Entered and Authorized by:   Etta Grandchild MD   Signed by:   Etta Grandchild MD on 07/04/2009   Method used:   Electronically to        [facility]  [phone]* (retail)       [address]       [place], [place]  [address]       Ph: [phone] or [phone]       Fax: [phone]   RxID:   [id number]

## 2010-04-30 NOTE — Progress Notes (Signed)
Summary: HYDROCODONE REQUEST  Phone Note Refill Request Message from:  Patient on December 12, 2009 9:06 AM  Refills Requested: Medication #1:  VICODIN ES 7.5-750 MG TABS 3-4 once daily   Supply Requested: 3 months Pt called and states that she needs a refill. She states her last fill was Aug 10 and with her taking medication 3 to 4 times a day she is out.    Initial call taken by: Ami Bullins CMA,  December 12, 2009 9:11 AM  Follow-up for Phone Call        ok Follow-up by: Etta Grandchild MD,  December 12, 2009 9:23 AM  Additional Follow-up for Phone Call Additional follow up Details #1::        What qty is ok per month? and how many refills and I will call in to pharm Additional Follow-up by: Lamar Sprinkles, CMA,  December 12, 2009 5:35 PM    Additional Follow-up for Phone Call Additional follow up Details #2::    Pt informed  Follow-up by: Lamar Sprinkles, CMA,  December 13, 2009 2:41 PM  Prescriptions: Haskell Flirt ES 7.5-750 MG TABS (HYDROCODONE-ACETAMINOPHEN) 3-4 once daily  #120 x 1   Entered by:   Lamar Sprinkles, CMA   Authorized by:   Etta Grandchild MD   Signed by:   Lamar Sprinkles, CMA on 12/13/2009   Method used:   Telephoned to ...       [facility]  [address]  [phone]* (retail)       [address]       [place], [place]  [address]       Ph: [phone] or [phone]       Fax: [phone]   RxID:   [id number] VICODIN ES 7.5-750 MG TABS (HYDROCODONE-ACETAMINOPHEN) 3-4 once daily  #120 x 1   Entered and Authorized by:   Etta Grandchild MD   Signed by:   Etta Grandchild MD on 12/13/2009   Method used:   Historical   RxID:   [id number]

## 2010-05-02 NOTE — Progress Notes (Signed)
  Phone Note Refill Request Message from:  Fax from Pharmacy on April 25, 2010 11:34 AM  Refills Requested: Medication #1:  LISINOPRIL 20 MG TABS Take 1 tablet by mouth once a day Initial call taken by: Rock Nephew CMA,  April 25, 2010 11:34 AM    Prescriptions: LISINOPRIL 20 MG TABS (LISINOPRIL) Take 1 tablet by mouth once a day  #30 x 11   Entered by:   Rock Nephew CMA   Authorized by:   Etta Grandchild MD   Signed by:   Rock Nephew CMA on 04/25/2010   Method used:   Faxed to ...       [facility]  [address]  [phone]* (retail)       [address]       [place], [place]  [address]       Ph: [phone] or [phone]       Fax: [phone]   RxID:   [id number]

## 2010-05-02 NOTE — Assessment & Plan Note (Signed)
Summary: FU---STC   Vital Signs:  Patient profile:   50 year old female Menstrual status:  hysterectomy Height:      59 inches Weight:      122 pounds BMI:     24.73 O2 Sat:      97 % on Room air Temp:     98.3 degrees F oral Pulse rate:   77 / minute Pulse rhythm:   regular Resp:     16 per minute BP sitting:   110 / 74 Cuff size:   large  Vitals Entered By: Rock Nephew CMA (March 21, 2010 10:02 AM)  O2 Flow:  Room air CC: Follow-up visit//med refills Is Patient Diabetic? No  Does patient need assistance? Functional Status Self care Ambulation Normal     Menstrual Status hysterectomy Last PAP Result NEGATIVE FOR INTRAEPITHELIAL LESIONS OR MALIGNANCY.   Primary Care Mishti Swanton:  Etta Grandchild MD  CC:  Follow-up visit//med refills.  History of Present Illness:  Follow-Up Visit      This is a 50 year old woman who presents for Follow-up visit.  The patient denies chest pain, palpitations, dizziness, syncope, edema, SOB, DOE, PND, and orthopnea.  Since the last visit the patient notes no new problems or concerns.  The patient reports taking meds as prescribed, monitoring BP, and dietary compliance.  When questioned about possible medication side effects, the patient notes none.    Preventive Screening-Counseling & Management  Alcohol-Tobacco     Alcohol drinks/day: 0     Alcohol Counseling: not indicated; patient does not drink     Smoking Status: current     Smoking Cessation Counseling: yes     Smoke Cessation Stage: contemplative     Packs/Day: 0.5     Cans of tobacco/week: no     Passive Smoke Exposure: yes     Tobacco Counseling: to quit use of tobacco products  Hep-HIV-STD-Contraception     Hepatitis Risk: no risk noted     HIV Risk: no     STD Risk: no risk noted      Sexual History:  currently monogamous.        Drug Use:  never.        Blood Transfusions:  no.    Clinical Review Panels:  Prevention   Last Mammogram:  normal  (07/18/2009)   Last Pap Smear:  NEGATIVE FOR INTRAEPITHELIAL LESIONS OR MALIGNANCY. (10/17/2009)   Last Colonoscopy:  DONE (01/24/2010)  Immunizations   Last Tetanus Booster:  Td (01/08/2007)  Lipid Management   Cholesterol:  249 (10/17/2009)   HDL (good cholesterol):  47.50 (10/17/2009)  Diabetes Management   Creatinine:  0.8 (01/10/2010)  CBC   WBC:  9.7 (01/10/2010)   RBC:  4.19 (01/10/2010)   Hgb:  13.7 (01/10/2010)   Hct:  39.5 (01/10/2010)   Platelets:  298.0 (01/10/2010)   MCV  94.3 (01/10/2010)   MCHC  34.7 (01/10/2010)   RDW  12.7 (01/10/2010)   PMN:  67.3 (01/10/2010)   Lymphs:  26.0 (01/10/2010)   Monos:  3.5 (01/10/2010)   Eosinophils:  2.7 (01/10/2010)   Basophil:  0.5 (01/10/2010)  Complete Metabolic Panel   Glucose:  91 (01/10/2010)   Sodium:  140 (01/10/2010)   Potassium:  5.0 (01/10/2010)   Chloride:  105 (01/10/2010)   CO2:  27 (01/10/2010)   BUN:  20 (01/10/2010)   Creatinine:  0.8 (01/10/2010)   Albumin:  4.7 (01/10/2010)   Total Protein:  7.5 (01/10/2010)   Calcium:  10.1 (01/10/2010)   Total Bili:  0.5 (01/10/2010)   Alk Phos:  94 (01/10/2010)   SGPT (ALT):  14 (01/10/2010)   SGOT (AST):  18 (01/10/2010)   Medications Prior to Update: 1)  Alprazolam 0.5 Mg Tabs (Alprazolam) .... Take 1 Tablet By Mouth Two Times A Day As Needed For Anxiety 2)  Vicodin Es 7.5-750 Mg Tabs (Hydrocodone-Acetaminophen) .... 3-4 Once Daily 3)  Temazepam 30 Mg Caps (Temazepam) .... Take 1 Tab By Mouth At Bedtime 4)  Lisinopril 20 Mg Tabs (Lisinopril) .... Take 1 Tablet By Mouth Once A Day 5)  Celebrex 200 Mg Caps (Celecoxib) .... One By Mouth Once Daily As Needed For Pain 6)  Ondansetron Hcl 8 Mg Tabs (Ondansetron Hcl) .... One By Mouth Q 8 Hours As Needed For Nausea  Current Medications (verified): 1)  Alprazolam 0.5 Mg Tabs (Alprazolam) .... Take 1 Tablet By Mouth Two Times A Day As Needed For Anxiety 2)  Vicodin Es 7.5-750 Mg Tabs (Hydrocodone-Acetaminophen)  .... One By Mouth Qid As Needed For Pain 3)  Temazepam 30 Mg Caps (Temazepam) .... Take 1 Tab By Mouth At Bedtime 4)  Lisinopril 20 Mg Tabs (Lisinopril) .... Take 1 Tablet By Mouth Once A Day 5)  Celebrex 200 Mg Caps (Celecoxib) .... One By Mouth Once Daily As Needed For Pain 6)  Ondansetron Hcl 8 Mg Tabs (Ondansetron Hcl) .... One By Mouth Q 8 Hours As Needed For Nausea  Allergies (verified): 1)  ! Penicillin 2)  ! Erythromycin 3)  ! 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 adopted  Social History: Reviewed history from 07/20/2008 and no changes required. 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   MS:  Complains of low back pain; denies joint pain, joint redness, joint swelling, loss of strength, mid back  pain, muscle aches, muscle, cramps, muscle weakness, stiffness, and thoracic pain. Psych:  Complains of anxiety; denies alternate hallucination ( auditory/visual), depression, easily angered, easily tearful, irritability, panic attacks, sense of great danger, suicidal thoughts/plans, thoughts of violence, unusual visions or sounds, and thoughts /plans of harming others.  Physical Exam  General:  alert, well-developed, well-nourished, well-hydrated, appropriate dress, normal appearance, healthy-appearing, cooperative to examination, and good hygiene.   Head:  normocephalic, atraumatic, no abnormalities observed, no abnormalities palpated, and no alopecia.   Eyes:  vision grossly intact, pupils equal, and no injection.   Ears:  R ear normal and L ear normal.   Mouth:  Oral mucosa and oropharynx without lesions or exudates.  Teeth in good repair. Neck:  supple, full ROM, no masses, no thyromegaly, no  thyroid nodules or tenderness, no JVD, normal carotid upstroke, no carotid bruits, no cervical lymphadenopathy, and no neck tenderness.   Lungs:  normal respiratory effort, no intercostal retractions, no accessory muscle use, normal breath sounds, no dullness, no fremitus, no crackles, and no wheezes.   Heart:  normal rate, regular rhythm, no murmur, no gallop, no rub, and no JVD.   Abdomen:  soft, non-tender, normal bowel sounds, no distention, no masses, no guarding, no rigidity, no rebound tenderness, no abdominal hernia, no inguinal hernia, no hepatomegaly, and no splenomegaly.   Msk:  No deformity or scoliosis noted of thoracic or lumbar spine.   Pulses:  R and L carotid,radial,femoral,dorsalis pedis and posterior tibial pulses are full and equal bilaterally Extremities:  No clubbing, cyanosis, edema, or deformity noted with normal full range of motion of all joints.   Neurologic:  alert & oriented X3, cranial nerves II-XII intact, strength normal in all extremities, sensation intact to light touch,  sensation intact to pinprick, and gait normal.   Skin:  Intact without suspicious lesions or rashes Cervical Nodes:  No lymphadenopathy noted Axillary Nodes:  no R axillary adenopathy and no L axillary adenopathy.   Inguinal Nodes:  no R inguinal adenopathy and no L inguinal adenopathy.   Psych:  Cognition and judgment appear intact. Alert and cooperative with normal attention span and concentration. No apparent delusions, illusions, hallucinations   Impression & Recommendations:  Problem # 1:  ANXIETY/DEPRESSION (ICD-300.4) Assessment Unchanged  Problem # 2:  TOBACCO USE (ICD-305.1) Assessment: Unchanged  Encouraged smoking cessation and discussed different methods for smoking cessation.   Orders: Tobacco use cessation intermediate 3-10 minutes (82956)  Problem # 3:  HYPERTENSION (ICD-401.9) Assessment: Improved  Her updated medication list for this problem includes:    Lisinopril 20 Mg Tabs (Lisinopril) .Marland Kitchen... Take 1 tablet by mouth once a day  BP today: 110/74 Prior BP: 110/68 (01/10/2010)  Prior 10 Yr Risk Heart Disease: Not enough information (12/29/2007)  Labs Reviewed: K+: 5.0 (01/10/2010) Creat: : 0.8 (01/10/2010)   Chol: 249 (10/17/2009)   HDL: 47.50 (10/17/2009)   TG: 248.0 (10/17/2009)  Orders: Tobacco use cessation intermediate 3-10 minutes (99406)  Problem # 4:  BACK PAIN (ICD-724.5) Assessment: Unchanged  Her updated medication list for this problem includes:    Vicodin Es 7.5-750 Mg Tabs (Hydrocodone-acetaminophen) ..... One by mouth qid as needed for pain    Celebrex 200 Mg Caps (Celecoxib) ..... One by mouth once daily as needed for pain  Complete Medication List: 1)  Alprazolam 0.5 Mg Tabs (Alprazolam) .... Take 1 tablet by mouth two times a day as needed for anxiety 2)  Vicodin Es 7.5-750 Mg Tabs (Hydrocodone-acetaminophen) .... One by mouth qid as needed for pain 3)  Temazepam 30 Mg Caps (Temazepam) .... Take 1 tab by mouth at bedtime 4)   Lisinopril 20 Mg Tabs (Lisinopril) .... Take 1 tablet by mouth once a day 5)  Celebrex 200 Mg Caps (Celecoxib) .... One by mouth once daily as needed for pain 6)  Ondansetron Hcl 8 Mg Tabs (Ondansetron hcl) .... One by mouth q 8 hours as needed for nausea  Patient Instructions: 1)  Please schedule a follow-up appointment in 4 months. 2)  Tobacco is very bad for your health and your loved ones! You Should stop smoking!. 3)  Stop Smoking Tips: Choose a Quit date. Cut down before the Quit date. decide what you will do as a substitute when you feel the urge to smoke(gum,toothpick,exercise). 4)  It  is important that you exercise regularly at least 20 minutes 5 times a week. If you develop chest pain, have severe difficulty breathing, or feel very tired , stop exercising immediately and seek medical attention. 5)  You need to lose weight. Consider a lower calorie diet and regular exercise.  6)  Check your Blood Pressure regularly. If it is above 140/90: you should make an appointment. Prescriptions: VICODIN ES 7.5-750 MG TABS (HYDROCODONE-ACETAMINOPHEN) One by mouth QID as needed for pain  #120 x 5   Entered and Authorized by:   Etta Grandchild MD   Signed by:   Etta Grandchild MD on 03/21/2010   Method used:   Print then Give to Patient   RxID:   [id number] TEMAZEPAM 30 MG CAPS (TEMAZEPAM) Take 1 tab by mouth at bedtime  #30 x 5   Entered and Authorized by:   Etta Grandchild MD   Signed by:   Etta Grandchild MD on 03/21/2010   Method used:   Print then Give to Patient   RxID:   [id number] ALPRAZOLAM 0.5 MG TABS (ALPRAZOLAM) Take 1 tablet by mouth two times a day as needed for anxiety  #60 x 5   Entered and Authorized by:   Etta Grandchild MD   Signed by:   Etta Grandchild MD on 03/21/2010   Method used:   Print then Give to Patient   RxID:   [id number]    Orders Added: 1)  Tobacco use cessation intermediate 3-10 minutes [99406] 2)  Est. Patient Level III [63016]

## 2010-05-21 ENCOUNTER — Ambulatory Visit (INDEPENDENT_AMBULATORY_CARE_PROVIDER_SITE_OTHER)
Admission: RE | Admit: 2010-05-21 | Discharge: 2010-05-21 | Disposition: A | Discharge status: Home / Self Care | Payer: Medicare PPO | Source: Ambulatory Visit | Attending: Internal Medicine | Admitting: Internal Medicine

## 2010-05-21 ENCOUNTER — Encounter: Payer: Self-pay | Admitting: Internal Medicine

## 2010-05-21 ENCOUNTER — Other Ambulatory Visit: Payer: Self-pay | Admitting: Internal Medicine

## 2010-05-21 ENCOUNTER — Ambulatory Visit (INDEPENDENT_AMBULATORY_CARE_PROVIDER_SITE_OTHER): Payer: Medicare PPO | Admitting: Internal Medicine

## 2010-05-21 DIAGNOSIS — F17200 Nicotine dependence, unspecified, uncomplicated: Secondary | ICD-10-CM

## 2010-05-21 DIAGNOSIS — S381XXA Crushing injury of abdomen, lower back, and pelvis, initial encounter: Secondary | ICD-10-CM

## 2010-05-21 DIAGNOSIS — M549 Dorsalgia, unspecified: Secondary | ICD-10-CM

## 2010-05-21 DIAGNOSIS — I10 Essential (primary) hypertension: Secondary | ICD-10-CM

## 2010-05-22 ENCOUNTER — Telehealth: Payer: Self-pay | Admitting: Internal Medicine

## 2010-05-28 NOTE — Progress Notes (Signed)
Summary: RESULTS  Phone Note Call from Patient Call back at Home Phone [phone]   Summary of Call: Patient is requesting results of xrays.  Initial call taken by: Lamar Sprinkles, CMA,  May 22, 2010 1:34 PM  Follow-up for Phone Call        normal Follow-up by: Etta Grandchild MD,  May 22, 2010 1:34 PM  Additional Follow-up for Phone Call Additional follow up Details #1::        Pt informed  Additional Follow-up by: Lamar Sprinkles, CMA,  May 22, 2010 3:51 PM

## 2010-05-28 NOTE — Assessment & Plan Note (Signed)
Summary: BOTTOM PAIN--FELL LAST FRI---STC   Vital Signs:  Patient profile:   50 year old female Menstrual status:  hysterectomy Height:      59 inches (149.86 cm) Weight:      98.19 pounds (44.63 kg) BMI:     19.90 O2 Sat:      96 % on Room air Temp:     98.3 degrees F (36.83 degrees C) oral Pulse rate:   66 / minute Pulse rhythm:   regular Resp:     16 per minute BP sitting:   102 / 68  (left arm) Cuff size:   regular  O2 Flow:  Room air CC: Pt fell on wooden bedrail 05/17/10 w/impact on tailbone..Pt c/o numbness & tingling in legs now/sls, cma, Back Pain Comments Pt has been using heat wraps..Pt needs refill on Celebrex.Pt states she stopped taking Ondansetron due to constipation/sls,cma   Primary Care Provider:  Etta Grandchild MD  CC:  Pt fell on wooden bedrail 05/17/10 w/impact on tailbone..Pt c/o numbness & tingling in legs now/sls, cma, and Back Pain.  History of Present Illness: She returns c/o low back pain after hitting her low back on a wooden bedrail 4 days ago. She had a brief episode of "pins and needles" sensation in her legs yesterday but today she tells me that her sensation and strength in the legs is normal.  Back Pain History:      The patient's back pain started approximately 05/17/2010.  The pain is located in the lower back region and does not radiate below the knees.  She states this is not work related.  On a scale of 1-10, she describes the pain as a 3.  She states that she has had a prior history of back pain.  The patient has not had any recent physical therapy for her back pain.  The following makes the back pain better: rest.  The following makes the back pain worse: movement.    Critical Exclusionary Diagnosis Criteria (CEDC) for Back Pain:      The patient gives a history of previous trauma.  She has no prior history of spinal surgery.  There are no symptoms to suggest infection, cauda equina, or psychosocial factors for back pain.  Cancer risk  factors include age >50 yrs with new back pain.     Preventive Screening-Counseling & Management  Alcohol-Tobacco     Alcohol drinks/day: 0     Alcohol Counseling: not indicated; patient does not drink     Smoking Status: quit < 6 months     Smoking Cessation Counseling: yes     Smoke Cessation Stage: contemplative     Packs/Day: 0.5     Year Quit: 2012     Cans of tobacco/week: no     Passive Smoke Exposure: yes     Tobacco Counseling: to remain off tobacco products  Hep-HIV-STD-Contraception     Hepatitis Risk: no risk noted     HIV Risk: no     STD Risk: no risk noted      Sexual History:  currently monogamous.        Drug Use:  never.        Blood Transfusions:  no.    Clinical Review Panels:  Prevention   Last Mammogram:  normal (07/18/2009)   Last Pap Smear:  NEGATIVE FOR INTRAEPITHELIAL LESIONS OR MALIGNANCY. (10/17/2009)   Last Colonoscopy:  DONE (01/24/2010)  Immunizations   Last Tetanus Booster:  Td (01/08/2007)  Lipid Management  Cholesterol:  249 (10/17/2009)   HDL (good cholesterol):  47.50 (10/17/2009)  Diabetes Management   Creatinine:  0.8 (01/10/2010)  CBC   WBC:  9.7 (01/10/2010)   RBC:  4.19 (01/10/2010)   Hgb:  13.7 (01/10/2010)   Hct:  39.5 (01/10/2010)   Platelets:  298.0 (01/10/2010)   MCV  94.3 (01/10/2010)   MCHC  34.7 (01/10/2010)   RDW  12.7 (01/10/2010)   PMN:  67.3 (01/10/2010)   Lymphs:  26.0 (01/10/2010)   Monos:  3.5 (01/10/2010)   Eosinophils:  2.7 (01/10/2010)   Basophil:  0.5 (01/10/2010)  Complete Metabolic Panel   Glucose:  91 (01/10/2010)   Sodium:  140 (01/10/2010)   Potassium:  5.0 (01/10/2010)   Chloride:  105 (01/10/2010)   CO2:  27 (01/10/2010)   BUN:  20 (01/10/2010)   Creatinine:  0.8 (01/10/2010)   Albumin:  4.7 (01/10/2010)   Total Protein:  7.5 (01/10/2010)   Calcium:  10.1 (01/10/2010)   Total Bili:  0.5 (01/10/2010)   Alk Phos:  94 (01/10/2010)   SGPT (ALT):  14 (01/10/2010)   SGOT (AST):  18  (01/10/2010)   Current Medications (verified): 1)  Alprazolam 0.5 Mg Tabs (Alprazolam) .... Take 1 Tablet By Mouth Two Times A Day As Needed For Anxiety 2)  Vicodin Es 7.5-750 Mg Tabs (Hydrocodone-Acetaminophen) .... One By Mouth Qid As Needed For Pain 3)  Temazepam 30 Mg Caps (Temazepam) .... Take 1 Tab By Mouth At Bedtime 4)  Lisinopril 20 Mg Tabs (Lisinopril) .... Take 1 Tablet By Mouth Once A Day 5)  Celebrex 200 Mg Caps (Celecoxib) .... One By Mouth Once Daily As Needed For Pain 6)  Ondansetron Hcl 8 Mg Tabs (Ondansetron Hcl) .... One By Mouth Q 8 Hours As Needed For Nausea  Allergies (verified): 1)  ! Penicillin 2)  ! Erythromycin 3)  ! 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 adopted  Social History: Reviewed history from 07/20/2008 and no changes required. 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  The patient denies anorexia, fever, weight loss, chest pain, syncope, dyspnea on exertion,  peripheral edema, prolonged cough, headaches, hemoptysis, abdominal pain, melena, hematochezia, severe indigestion/heartburn, muscle weakness, suspicious skin lesions, difficulty walking, and enlarged lymph nodes.   MS:  Complains of low back pain and stiffness; denies joint pain, joint redness, joint swelling, loss of strength, muscle aches, and muscle weakness.  Physical Exam  General:  alert, well-developed, well-nourished, well-hydrated, appropriate dress, normal appearance, healthy-appearing, cooperative to examination, and good hygiene.   Head:  normocephalic, atraumatic, no abnormalities observed, no abnormalities palpated, and no alopecia.   Mouth:  Oral mucosa and oropharynx without lesions or  exudates.  Teeth in good repair. Neck:  supple, full ROM, no masses, no thyromegaly, no thyroid nodules or tenderness, no JVD, normal carotid upstroke, no carotid bruits, no cervical lymphadenopathy, and no neck tenderness.   Lungs:  normal respiratory effort, no intercostal retractions, no accessory muscle use, normal breath sounds, no dullness, no fremitus, no crackles, and no wheezes.   Heart:  normal rate, regular rhythm, no murmur, no gallop, no rub, and no JVD.   Abdomen:  soft, non-tender, normal bowel sounds, no distention, no masses, no guarding, no rigidity, no rebound tenderness, no abdominal hernia, no inguinal hernia, no hepatomegaly, and no splenomegaly.   Msk:  normal ROM, no joint tenderness, no joint swelling, no joint warmth, no redness over joints, no joint deformities, no joint instability, no crepitation, and no muscle atrophy.   Pulses:  R and L carotid,radial,femoral,dorsalis pedis and posterior tibial pulses are full and equal bilaterally Extremities:  No clubbing, cyanosis, edema, or deformity noted with normal full range of motion of all joints.   Neurologic:  No cranial nerve deficits noted. Station and gait are normal. Plantar reflexes are down-going bilaterally. DTRs are  symmetrical throughout. Sensory, motor and coordinative functions appear intact. Skin:  Intact without suspicious lesions or rashes, no ecchymoses.   Cervical Nodes:  No lymphadenopathy noted Axillary Nodes:  No palpable lymphadenopathy Inguinal Nodes:  No significant adenopathy Psych:  Cognition and judgment appear intact. Alert and cooperative with normal attention span and concentration. No apparent delusions, illusions, hallucinations  Low Back Pain Physical Exam:    Inspection-deformity:     No    Palpation-spinal tenderness:   No    Motor Exam/Strength:         Left Ankle Dorsiflexion (L5,L4):     normal       Left Great Toe Dorsiflexion (L5,L4):     normal       Left Heel Walk (L5,some L4):     normal       Left Single Squat & Rise-Quads (L4):   normal       Left Toe Walk-calf (S1):       normal       Right Ankle Dorsiflexion (L5,L4):     normal       Right Great Toe Dorsiflexion (L5,L4):       normal       Right Heel Walk (L5,some L4):     normal       Right Single Squat & Rise Quads (L4):   normal       Right Toe Walk-calf (S1):       normal    Sensory Exam/Pinprick:        Left Medial Foot (L4):   normal       Left Dorsal Foot (L5):   normal       Left Lateral Foot (S1):   normal       Right Medial Foot (L4):   normal       Right Dorsal Foot (L5):   normal       Right Lateral Foot (S1):   normal    Reflexes:        Left Knee Jerk (L4):     normal       Left Ankle Reflex (S1):   normal       Right Knee Jerk:     normal       Right Ankle Reflex (S1):   normal    Straight Leg Raise (SLR):  Left Straight Leg Raise (SLR):   negative       Right Straight Leg Raise (SLR):   negative   Impression & Recommendations:  Problem # 1:  CRUSHING INJURY, BACK (ICD-926.11) Assessment New  Orders: T-Lumbar Spine Complete, 5 Views (71110TC)  Problem # 2:  BACK PAIN (ICD-724.5) Assessment: Deteriorated  The following medications were removed from the medication list:     Celebrex 200 Mg Caps (Celecoxib) ..... One by mouth once daily as needed for pain Her updated medication list for this problem includes:    Vicodin Es 7.5-750 Mg Tabs (Hydrocodone-acetaminophen) ..... One by mouth qid as needed for pain    Vimovo 500-20 Mg Tbec (Naproxen-esomeprazole) ..... One by mouth two times a day with food as needed for low back pain  Orders: T-Lumbar Spine Complete, 5 Views (71110TC) Pain Clinic Referral (Pain)  Discussed use of moist heat or ice, modified activities, medications, and stretching/strengthening exercises. Back care instructions given. To be seen in 2 weeks if no improvement; sooner if worsening of symptoms.   Problem # 3:  HYPERTENSION (ICD-401.9) Assessment: Improved  Her updated medication list for this problem includes:    Lisinopril 20 Mg Tabs (Lisinopril) .Marland Kitchen... Take 1 tablet by mouth once a day  BP today: 102/68 Prior BP: 110/74 (03/21/2010)  Prior 10 Yr Risk Heart Disease: Not enough information (12/29/2007)  Labs Reviewed: K+: 5.0 (01/10/2010) Creat: : 0.8 (01/10/2010)   Chol: 249 (10/17/2009)   HDL: 47.50 (10/17/2009)   TG: 248.0 (10/17/2009)  Problem # 4:  TOBACCO USE (ICD-305.1) Assessment: Improved  Encouraged smoking cessation and discussed different methods for smoking cessation.   Complete Medication List: 1)  Alprazolam 0.5 Mg Tabs (Alprazolam) .... Take 1 tablet by mouth two times a day as needed for anxiety 2)  Vicodin Es 7.5-750 Mg Tabs (Hydrocodone-acetaminophen) .... One by mouth qid as needed for pain 3)  Temazepam 30 Mg Caps (Temazepam) .... Take 1 tab by mouth at bedtime 4)  Lisinopril 20 Mg Tabs (Lisinopril) .... Take 1 tablet by mouth once a day 5)  Ondansetron Hcl 8 Mg Tabs (Ondansetron hcl) .... One by mouth q 8 hours as needed for nausea 6)  Vimovo 500-20 Mg Tbec (Naproxen-esomeprazole) .... One by mouth two times a day with food as needed for low back pain  Patient Instructions: 1)  Please schedule a follow-up  appointment in 1 month. 2)  Take 650-1000mg  of Tylenol every 4-6 hours as needed for relief of pain or comfort of fever AVOID taking more than 4000mg   in a 24 hour period (can cause liver damage in higher doses). 3)  Most patients (90%) with low back pain will improve with time (2-6 weeks). Keep active but avoid activities that are painful. Apply moist heat and/or ice to lower back several times a day. 4)  Check your Blood Pressure regularly. If it is above 140/90: you should make an appointment. Prescriptions: VIMOVO 500-20 MG TBEC (NAPROXEN-ESOMEPRAZOLE) One by mouth two times a day with food as needed for low back pain  #36 x 0   Entered and Authorized by:   Etta Grandchild MD   Signed by:   Etta Grandchild MD on 05/21/2010   Method used:   Samples Given   RxID:   [id number]    Orders Added: 1)  T-Lumbar Spine Complete, 5 Views [71110TC] 2)  Pain Clinic Referral [Pain] 3)  Est. Patient Level IV [13086]

## 2010-06-12 LAB — DIFFERENTIAL
Basophils Absolute: 0 10*3/uL (ref 0.0–0.1)
Basophils Relative: 0 % (ref 0–1)
Eosinophils Absolute: 0.1 10*3/uL (ref 0.0–0.7)
Monocytes Absolute: 0.5 10*3/uL (ref 0.1–1.0)
Monocytes Relative: 4 % (ref 3–12)
Neutro Abs: 7.6 10*3/uL (ref 1.7–7.7)
Neutrophils Relative %: 66 % (ref 43–77)

## 2010-06-12 LAB — URINALYSIS, ROUTINE W REFLEX MICROSCOPIC
Bilirubin Urine: NEGATIVE
Glucose, UA: NEGATIVE mg/dL
Ketones, ur: NEGATIVE mg/dL
Protein, ur: NEGATIVE mg/dL
Urobilinogen, UA: 0.2 mg/dL (ref 0.0–1.0)

## 2010-06-12 LAB — CBC
HCT: 40 % (ref 36.0–46.0)
Hemoglobin: 13.7 g/dL (ref 12.0–15.0)
MCV: 93.4 fL (ref 78.0–100.0)
RBC: 4.29 MIL/uL (ref 3.87–5.11)
RDW: 13.1 % (ref 11.5–15.5)
WBC: 11.5 10*3/uL — ABNORMAL HIGH (ref 4.0–10.5)

## 2010-06-12 LAB — COMPREHENSIVE METABOLIC PANEL
ALT: 23 U/L (ref 0–35)
Alkaline Phosphatase: 105 U/L (ref 39–117)
BUN: 14 mg/dL (ref 6–23)
Chloride: 103 mEq/L (ref 96–112)
Glucose, Bld: 109 mg/dL — ABNORMAL HIGH (ref 70–99)
Potassium: 3.8 mEq/L (ref 3.5–5.1)
Sodium: 142 mEq/L (ref 135–145)
Total Bilirubin: 0.4 mg/dL (ref 0.3–1.2)
Total Protein: 7.9 g/dL (ref 6.0–8.3)

## 2010-06-12 LAB — URINE MICROSCOPIC-ADD ON

## 2010-07-08 ENCOUNTER — Encounter: Payer: Self-pay | Admitting: Internal Medicine

## 2010-07-08 ENCOUNTER — Ambulatory Visit (INDEPENDENT_AMBULATORY_CARE_PROVIDER_SITE_OTHER): Payer: Medicare PPO | Admitting: Internal Medicine

## 2010-07-08 DIAGNOSIS — I10 Essential (primary) hypertension: Secondary | ICD-10-CM

## 2010-07-08 DIAGNOSIS — M549 Dorsalgia, unspecified: Secondary | ICD-10-CM

## 2010-07-08 DIAGNOSIS — E785 Hyperlipidemia, unspecified: Secondary | ICD-10-CM

## 2010-07-08 NOTE — Assessment & Plan Note (Signed)
BP is well controlled 

## 2010-07-08 NOTE — Progress Notes (Signed)
Subjective:    Patient ID: Barbara [name], female    DOB: 1960-07-20, 50 y.o.   MRN: [id number]  Hypertension This is a chronic problem. The current episode started more than 1 year ago. The problem is unchanged. The problem is controlled. Associated symptoms include anxiety. Pertinent negatives include no blurred vision, chest pain, headaches, malaise/fatigue, neck pain, orthopnea, palpitations, peripheral edema, PND, shortness of breath or sweats. There are no associated agents to hypertension. Risk factors for coronary artery disease include smoking/tobacco exposure and stress. Past treatments include ACE inhibitors. The current treatment provides significant improvement. There are no compliance problems.  There is no history of angina, kidney disease, CVA, heart failure or left ventricular hypertrophy.      Review of Systems  Constitutional: Negative for fever, chills, malaise/fatigue, diaphoresis, activity change, appetite change, fatigue and unexpected weight change.  HENT: Negative for congestion, sore throat, facial swelling, sneezing, trouble swallowing, neck pain, neck stiffness, voice change and tinnitus.   Eyes: Negative for blurred vision, pain, redness and itching.  Respiratory: Negative for cough, choking, shortness of breath, wheezing and stridor.   Cardiovascular: Negative for chest pain, palpitations, orthopnea, leg swelling and PND.  Gastrointestinal: Negative for nausea, vomiting, abdominal pain, diarrhea, constipation, blood in stool and abdominal distention.  Genitourinary: Negative for dysuria, urgency, frequency, hematuria, flank pain, decreased urine volume and enuresis.  Musculoskeletal: Positive for back pain. Negative for joint swelling, arthralgias and gait problem.  Skin: Negative for color change, pallor and rash.  Neurological: Negative for dizziness, tremors, seizures, syncope, facial asymmetry, speech difficulty, weakness, light-headedness, numbness and headaches.    Hematological: Negative for adenopathy. Does not bruise/bleed easily.  Psychiatric/Behavioral: Negative for suicidal ideas, hallucinations, behavioral problems, confusion, sleep disturbance, self-injury, dysphoric mood, decreased concentration and agitation. The patient is not nervous/anxious and is not hyperactive.        Objective:   Physical Exam  Constitutional: She is oriented to person, place, and time. She appears well-developed and well-nourished. No distress.  HENT:  Head: Normocephalic and atraumatic.  Right Ear: External ear normal.  Left Ear: External ear normal.  Nose: Nose normal.  Mouth/Throat: Oropharynx is clear and moist. No oropharyngeal exudate.  Eyes: Conjunctivae are normal. Pupils are equal, round, and reactive to light. Right eye exhibits no discharge. Left eye exhibits no discharge. No scleral icterus.  Neck: Normal range of motion. Neck supple. No thyromegaly present.  Cardiovascular: Normal rate, regular rhythm, normal heart sounds and intact distal pulses.  Exam reveals no gallop and no friction rub.   No murmur heard. Pulmonary/Chest: Effort normal and breath sounds normal. No respiratory distress. She has no wheezes. She has no rales. She exhibits no tenderness.  Abdominal: Soft. Bowel sounds are normal. She exhibits no distension. There is no tenderness. There is no rebound and no guarding.  Musculoskeletal: Normal range of motion. She exhibits no edema and no tenderness.  Lymphadenopathy:    She has no cervical adenopathy.  Neurological: She is alert and oriented to person, place, and time. She has normal reflexes. No cranial nerve deficit.  Skin: Skin is warm. No rash noted. She is not diaphoretic. No erythema. No pallor.  Psychiatric: She has a normal mood and affect. Her behavior is normal. Judgment and thought content normal.       Lab Results  Component Value Date   WBC 11.5* 01/16/2010   HGB 13.7 01/16/2010   HCT 40.0 01/16/2010   PLT 325  01/16/2010   CHOL 249* 10/17/2009   TRIG 248.0*  10/17/2009   HDL 47.50 10/17/2009   LDLDIRECT 160.3 10/17/2009   ALT 23 01/16/2010   AST 26 01/16/2010   NA 142 01/16/2010   K 3.8 01/16/2010   CL 103 01/16/2010   CREATININE 0.82 01/16/2010   BUN 14 01/16/2010   CO2 28 01/16/2010   TSH 1.00 01/10/2010      Assessment & Plan:

## 2010-07-08 NOTE — Patient Instructions (Signed)

## 2010-07-08 NOTE — Assessment & Plan Note (Signed)
This is unchanged, she is getting relief with her current meds, she tells me that she is seeing pain management this week and may consider having a procedure done foe her LBP, she has no s/s of radiculopathy tpday

## 2010-07-10 LAB — CBC
HCT: 33.8 % — ABNORMAL LOW (ref 36.0–46.0)
Hemoglobin: 11.7 g/dL — ABNORMAL LOW (ref 12.0–15.0)
MCHC: 34.7 g/dL (ref 30.0–36.0)
MCV: 92.5 fL (ref 78.0–100.0)
Platelets: 253 10*3/uL (ref 150–400)
RBC: 3.66 MIL/uL — ABNORMAL LOW (ref 3.87–5.11)
RDW: 12.9 % (ref 11.5–15.5)
WBC: 10.6 10*3/uL — ABNORMAL HIGH (ref 4.0–10.5)

## 2010-07-10 LAB — BASIC METABOLIC PANEL
BUN: 10 mg/dL (ref 6–23)
CO2: 27 mEq/L (ref 19–32)
Calcium: 9.2 mg/dL (ref 8.4–10.5)
Chloride: 105 mEq/L (ref 96–112)
Creatinine, Ser: 0.72 mg/dL (ref 0.4–1.2)
GFR calc Af Amer: >60 mL/min (ref >60)
GFR calc non Af Amer: >60 mL/min (ref >60)
Glucose, Bld: 99 mg/dL (ref 70–99)
Potassium: 4.1 mEq/L (ref 3.5–5.1)
Sodium: 139 mEq/L (ref 135–145)

## 2010-07-10 LAB — POCT CARDIAC MARKERS
CKMB, poc: <1 ng/mL — ABNORMAL LOW (ref 1.0–8.0)
Myoglobin, poc: 24.7 ng/mL (ref 12–200)
Troponin i, poc: <0.05 ng/mL (ref 0.00–0.09)

## 2010-07-10 LAB — DIFFERENTIAL
Basophils Relative: 0 % (ref 0–1)
Eosinophils Absolute: 0.2 10*3/uL (ref 0.0–0.7)
Lymphs Abs: 2.6 10*3/uL (ref 0.7–4.0)
Neutro Abs: 7.3 10*3/uL (ref 1.7–7.7)
Neutrophils Relative %: 69 % (ref 43–77)

## 2010-07-12 ENCOUNTER — Ambulatory Visit (HOSPITAL_BASED_OUTPATIENT_CLINIC_OR_DEPARTMENT_OTHER): Payer: Medicare PPO | Admitting: Physical Medicine & Rehabilitation

## 2010-07-12 ENCOUNTER — Encounter: Payer: Medicare PPO | Attending: Physical Medicine & Rehabilitation

## 2010-07-12 DIAGNOSIS — M5030 Other cervical disc degeneration, unspecified cervical region: Secondary | ICD-10-CM | POA: Insufficient documentation

## 2010-07-12 DIAGNOSIS — IMO0002 Reserved for concepts with insufficient information to code with codable children: Secondary | ICD-10-CM

## 2010-07-12 DIAGNOSIS — Z79899 Other long term (current) drug therapy: Secondary | ICD-10-CM | POA: Insufficient documentation

## 2010-07-12 DIAGNOSIS — R209 Unspecified disturbances of skin sensation: Secondary | ICD-10-CM | POA: Insufficient documentation

## 2010-07-12 DIAGNOSIS — M542 Cervicalgia: Secondary | ICD-10-CM | POA: Insufficient documentation

## 2010-07-12 DIAGNOSIS — I10 Essential (primary) hypertension: Secondary | ICD-10-CM | POA: Insufficient documentation

## 2010-07-12 DIAGNOSIS — M25559 Pain in unspecified hip: Secondary | ICD-10-CM | POA: Insufficient documentation

## 2010-07-12 DIAGNOSIS — M76899 Other specified enthesopathies of unspecified lower limb, excluding foot: Secondary | ICD-10-CM | POA: Insufficient documentation

## 2010-07-12 DIAGNOSIS — M546 Pain in thoracic spine: Secondary | ICD-10-CM | POA: Insufficient documentation

## 2010-07-12 DIAGNOSIS — M545 Low back pain, unspecified: Secondary | ICD-10-CM | POA: Insufficient documentation

## 2010-07-12 DIAGNOSIS — M48061 Spinal stenosis, lumbar region without neurogenic claudication: Secondary | ICD-10-CM

## 2010-07-13 NOTE — Procedures (Signed)
[name], Barbara [name]                ACCOUNT NO.:  [id number]  MEDICAL RECORD NO.:  [id number]           PATIENT TYPE:  O  LOCATION:  [facility]                          FACILITY:  [facility]  PHYSICIAN:  Erick Colace, M.D.DATE OF BIRTH:  1960/09/24  DATE OF PROCEDURE:  07/12/2010 DATE OF DISCHARGE:                              OPERATIVE REPORT  PROCEDURE:  Right trochanteric bursa injection.  INDICATION:  Trochanteric bursitis pain persists despite nonsteroidal anti-inflammatory.  Informed consent was obtained after describing risks and benefits of the procedure with the patient.  These include bleeding, bruising, and infection.  She elects to proceed and has given written consent. [name], LPN was in with [place] for the procedure.  Left lateral decubitus positioning, area on the right hip marked, prepped with chlorhexidine, entered with 25-gauge inch and half needle.  A 0.5 mL of 40 mg/mL Depo-Medrol and 4 mL of 1% lidocaine injected after negative drawback for blood.  The patient tolerated procedure well. Postprocedure instructions given.     Erick Colace, M.D. Electronically Signed    AEK/MEDQ  D:  07/12/2010 11:14:46  T:  07/13/2010 01:23:21  Job:  [id number]

## 2010-07-13 NOTE — Assessment & Plan Note (Signed)
REASON FOR VISIT:  Neck pain, mid back pain, low back pain, and hip pain.  HISTORY:  Barbara [name] is a 50 year old female who has a long history of neck and back pain.  She has had neck pain for greater than 3 years. Her last MRI was performed on September 06, 2009, which showed improvement in left paracentral disk protrusion at C5-6 and a small central disk at C6- 7 which was stable compared to prior MRI in 2009.  She has had lumbar spine x-rays which were unremarkable.  Disk spaces were preserved.  She has had EMG NCV on July 28, 2008, normal left upper and left lower extremity.  The patient is on Vimovo which is a combination of naproxen and omeprazole medication.  She has been also on hydrocodone 7.5/325, takes an average of 3-4 tablets per day.  She is taking temazepam 30 mg at night for sleep, alprazolam 0.5 b.i.d. for anxiety in addition to lisinopril for blood pressure.  PAST MEDICAL HISTORY:  Hypertension, depression, and anxiety.  Past history gives a history of fall onto her tail bone on May 17, 2010. X-rays were negative.  SOCIAL HISTORY:  Physicist, medical, on disability, finishing a Personnel officer.  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  She has mild lower extremity pain, does not really describe much in terms of numbness or tingling.  Negative for weakness.  Opioid risk tool score 1.  Please see health and history form for additional review of systems, family, social, and past history.  PHYSICAL EXAMINATION:  MUSCULOSKELETAL:  Straight leg raising test is negative bilaterally.  Hip range of motion is good.  She does have tenderness over bilateral trochanteric bursa.  Knee and ankle range of motion are full.  Does have crepitus at the knees.  Neck has 75% range forward flexion/extension, lateral rotation, and bending.  Upper extremity strength is normal.  Shoulders negative for  impingement sign. She does have tenderness in bilateral upper trapezius.  Her lumbar spine has 50% range forward flexion/extension, lateral rotation, and bending. No directional component.  Sensation mildly reduced in the L3 dermatomal distribution bilaterally, also in the right hand compared to left hand, primarily at the index and middle finger.  Deep tendon reflexes are normal in bilateral upper and lower extremities.  Muscle bulk is good in bilateral upper and lower extremities.  Strength is normal in bilateral upper and lower extremity.  IMPRESSION: 1. Cervical degenerative disk disease.  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  She has had a normal EMG of left upper extremity 2     years ago but did not have the right hand studied. 2. Back pain.  She gives a history of bulging disk.  No recent MRI.     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  We did discussed the need for     regular exercise.  I have sent her over to physical therapy to get     a program for this. 3. Lower extremity numbness in L3 distribution, does not appear to be     involving any motor component.  We will see how she does with  physical therapy.  If not much better in a month or so, we will     order MRI of the lumbar spine. 4. Trochanteric bursitis.  We will work on this in physical therapy     but also do a trochanteric bursa injection on the right side today.  Discussed with the patient and agrees with plan.  She will remain on her Vicodin and naproxen for now.  She has plenty of medication left. Oswestry score today 54%.     Erick Colace, M.D. Electronically Signed    AEK/MedQ D:  07/12/2010 11:13:32  T:  07/13/2010 01:12:06  Job #:  [id number]

## 2010-07-22 ENCOUNTER — Ambulatory Visit: Payer: Medicare PPO

## 2010-08-05 ENCOUNTER — Ambulatory Visit: Payer: Medicare PPO | Attending: Physical Medicine & Rehabilitation

## 2010-08-05 DIAGNOSIS — IMO0001 Reserved for inherently not codable concepts without codable children: Secondary | ICD-10-CM | POA: Insufficient documentation

## 2010-08-05 DIAGNOSIS — M25559 Pain in unspecified hip: Secondary | ICD-10-CM | POA: Insufficient documentation

## 2010-08-05 DIAGNOSIS — M542 Cervicalgia: Secondary | ICD-10-CM | POA: Insufficient documentation

## 2010-08-05 DIAGNOSIS — M545 Low back pain, unspecified: Secondary | ICD-10-CM | POA: Insufficient documentation

## 2010-08-05 DIAGNOSIS — M2569 Stiffness of other specified joint, not elsewhere classified: Secondary | ICD-10-CM | POA: Insufficient documentation

## 2010-08-07 ENCOUNTER — Ambulatory Visit: Payer: Medicare PPO | Admitting: Physical Therapy

## 2010-08-12 ENCOUNTER — Ambulatory Visit: Payer: Medicare PPO

## 2010-08-13 ENCOUNTER — Ambulatory Visit (HOSPITAL_BASED_OUTPATIENT_CLINIC_OR_DEPARTMENT_OTHER): Payer: Medicare PPO | Admitting: Physical Medicine & Rehabilitation

## 2010-08-13 ENCOUNTER — Encounter: Payer: Medicare PPO | Attending: Physical Medicine & Rehabilitation

## 2010-08-13 DIAGNOSIS — Z79899 Other long term (current) drug therapy: Secondary | ICD-10-CM | POA: Insufficient documentation

## 2010-08-13 DIAGNOSIS — M546 Pain in thoracic spine: Secondary | ICD-10-CM | POA: Insufficient documentation

## 2010-08-13 DIAGNOSIS — M542 Cervicalgia: Secondary | ICD-10-CM

## 2010-08-13 DIAGNOSIS — R209 Unspecified disturbances of skin sensation: Secondary | ICD-10-CM | POA: Insufficient documentation

## 2010-08-13 DIAGNOSIS — M25559 Pain in unspecified hip: Secondary | ICD-10-CM | POA: Insufficient documentation

## 2010-08-13 DIAGNOSIS — M5030 Other cervical disc degeneration, unspecified cervical region: Secondary | ICD-10-CM | POA: Insufficient documentation

## 2010-08-13 DIAGNOSIS — M545 Low back pain, unspecified: Secondary | ICD-10-CM | POA: Insufficient documentation

## 2010-08-13 DIAGNOSIS — M76899 Other specified enthesopathies of unspecified lower limb, excluding foot: Secondary | ICD-10-CM | POA: Insufficient documentation

## 2010-08-13 DIAGNOSIS — I10 Essential (primary) hypertension: Secondary | ICD-10-CM | POA: Insufficient documentation

## 2010-08-13 DIAGNOSIS — G894 Chronic pain syndrome: Secondary | ICD-10-CM

## 2010-08-13 DIAGNOSIS — M5430 Sciatica, unspecified side: Secondary | ICD-10-CM

## 2010-08-14 NOTE — Assessment & Plan Note (Signed)
[name] is back with complaints of neck and back pain.  She is a new patient that is back for second appointment.  She states nothing has changed with her pain level.  Her urine drug screen came back __________ prescribed the hydrocodone but not the alprazolam and she understands this.  She states her pain is about 5, it is burning and aching type pain in her neck down to her spine into her hips.  Today, she does not have any pain in her greater trochanter areas.  Activity level is about 5/6 in the morning and evening, it is worst time for pain.  She does not indicate what worsens or improves the pain.  She walks without assistance.  She walks about 30 minutes without any problem.  She climb steps and drives.  She is in disability.  REVIEW OF SYSTEMS:  Notable for those difficulties as well as some tingling in her extremities and some anxiety and weakness and bladder control problems.  PAST MEDICAL HISTORY:  Unchanged.  SOCIAL HISTORY:  She is married, lives with her husband.  FAMILY HISTORY:  Unchanged.  PHYSICAL EXAMINATION:  VITAL SIGNS:  Physical exam today shows blood pressure 138/86, pulse 75, respirations 20, and O2 sats 95% on room air. GENERAL:  Her strength is good in her lower extremities.  Sensation is good bilaterally in upper and lower extremities.  She is alert and oriented x3.  She has normal gait.  Constitutionally, she is within normal limits.  She tells me she is following Physical Therapy at [facility].  She does have a TENS unit.  She thinks that is going to help.  She just got her hydrocodone refilled.  She has another refill on it.  ASSESSMENT: 1. Cervicalgia. 2. Low back pain. 3. Anxiety.  PLAN: 1. Continue physical therapy on TENS unit. 2. We will see her back in clinic in 2 months. 3. She may need a muscle relaxant so we may call in and make a     decision on which one at that time.  She knows     to let us know if she decides to go that  way. 4. She will follow up in the clinic in 2 months.  At that time, she     may need the hydrocodone refilled.     Remus Hagedorn L. Blima Dessert     ______________________________ Erick Colace, M.D.   RLW/MedQ D:  08/13/2010 14:41:39  T:  08/14/2010 03:15:14  Job #:  [id number]

## 2010-08-19 ENCOUNTER — Ambulatory Visit: Payer: Medicare PPO | Admitting: Physical Therapy

## 2010-08-23 ENCOUNTER — Ambulatory Visit: Payer: Medicare PPO | Admitting: Physical Therapy

## 2010-08-27 ENCOUNTER — Encounter: Payer: Self-pay | Admitting: Internal Medicine

## 2010-08-28 ENCOUNTER — Ambulatory Visit (INDEPENDENT_AMBULATORY_CARE_PROVIDER_SITE_OTHER): Payer: Medicare PPO | Admitting: Internal Medicine

## 2010-08-28 ENCOUNTER — Encounter: Payer: Self-pay | Admitting: Internal Medicine

## 2010-08-28 VITALS — BP 118/72 | HR 70 | Temp 98.4°F | Wt 127.0 lb

## 2010-08-28 DIAGNOSIS — IMO0002 Reserved for concepts with insufficient information to code with codable children: Secondary | ICD-10-CM

## 2010-08-28 DIAGNOSIS — M5416 Radiculopathy, lumbar region: Secondary | ICD-10-CM | POA: Insufficient documentation

## 2010-08-28 DIAGNOSIS — M549 Dorsalgia, unspecified: Secondary | ICD-10-CM

## 2010-08-28 DIAGNOSIS — M5137 Other intervertebral disc degeneration, lumbosacral region: Secondary | ICD-10-CM

## 2010-08-28 DIAGNOSIS — M5412 Radiculopathy, cervical region: Secondary | ICD-10-CM

## 2010-08-28 DIAGNOSIS — K589 Irritable bowel syndrome without diarrhea: Secondary | ICD-10-CM | POA: Insufficient documentation

## 2010-08-28 DIAGNOSIS — I10 Essential (primary) hypertension: Secondary | ICD-10-CM

## 2010-08-28 DIAGNOSIS — M5030 Other cervical disc degeneration, unspecified cervical region: Secondary | ICD-10-CM

## 2010-08-28 MED ORDER — GLYCOPYRROLATE 2 MG PO TABS: 2.0000 mg | ORAL_TABLET | Freq: Three times a day (TID) | ORAL | Status: DC

## 2010-08-28 NOTE — Assessment & Plan Note (Signed)
Her BP is well controlled 

## 2010-08-28 NOTE — Assessment & Plan Note (Signed)
She has worsening symptoms and she tells me that the PA with Dr. Wynn Banker told her that she needs to have neck surgery done so i will get an MRI of her C-spine done to look at her anatomy

## 2010-08-28 NOTE — Assessment & Plan Note (Signed)
She does not want to change her meds for pain

## 2010-08-28 NOTE — Progress Notes (Signed)
Subjective:    Patient ID: Barbara [name], female    DOB: May 25, 1960, 50 y.o.   MRN: [id number]  HPI She returns for f/up and tells me that she has been feeling poorly for about 3 weeks with abd. Bloating and some incontinence of stool with worsening LBP. Also, she has had worsening neck pain and some weakness in her left arm.    Review of Systems  Constitutional: Negative for fever, chills, diaphoresis, activity change, appetite change, fatigue and unexpected weight change.  HENT: Positive for neck pain. Negative for sore throat, facial swelling, trouble swallowing, neck stiffness and voice change.   Eyes: Negative for photophobia and visual disturbance.  Respiratory: Negative for apnea, cough, shortness of breath, wheezing and stridor.   Cardiovascular: Negative for chest pain, palpitations and leg swelling.  Gastrointestinal: Negative for nausea, vomiting, abdominal pain, diarrhea, constipation, blood in stool and abdominal distention.  Genitourinary: Negative for dysuria, urgency, frequency, flank pain, decreased urine volume, enuresis, difficulty urinating and dyspareunia.  Musculoskeletal: Negative for myalgias, joint swelling, arthralgias and gait problem.  Neurological: Positive for weakness (left arm). Negative for dizziness, tremors, seizures, syncope, facial asymmetry, speech difficulty, light-headedness, numbness and headaches.  Hematological: Negative for adenopathy. Does not bruise/bleed easily.  Psychiatric/Behavioral: Negative for suicidal ideas, hallucinations, behavioral problems, confusion, sleep disturbance, self-injury, dysphoric mood, decreased concentration and agitation. The patient is not nervous/anxious and is not hyperactive.        Objective:   Physical Exam  Constitutional: She is oriented to person, place, and time. She appears well-developed and well-nourished. No distress.  HENT:  Head: Normocephalic and atraumatic.  Right Ear: External ear normal.  Left  Ear: External ear normal.  Nose: Nose normal.  Mouth/Throat: Oropharynx is clear and moist. No oropharyngeal exudate.  Eyes: Conjunctivae and EOM are normal. Pupils are equal, round, and reactive to light. Right eye exhibits no discharge. Left eye exhibits no discharge. No scleral icterus.  Neck: Normal range of motion. Neck supple. No JVD present. No tracheal deviation present. No thyromegaly present.  Cardiovascular: Normal rate, regular rhythm, normal heart sounds and intact distal pulses.  Exam reveals no gallop and no friction rub.   No murmur heard. Pulmonary/Chest: Effort normal and breath sounds normal. No stridor. No respiratory distress. She has no wheezes. She has no rales. She exhibits no tenderness.  Abdominal: Soft. Bowel sounds are normal. She exhibits no distension and no mass. There is no tenderness. There is no rebound and no guarding.  Musculoskeletal: Normal range of motion. She exhibits no edema and no tenderness.  Lymphadenopathy:    She has no cervical adenopathy.  Neurological: She is alert and oriented to person, place, and time. She displays no atrophy, no tremor and normal reflexes. No cranial nerve deficit or sensory deficit. She exhibits normal muscle tone. She displays no seizure activity. Coordination and gait normal.  Reflex Scores:      Tricep reflexes are 1+ on the right side and 1+ on the left side.      Bicep reflexes are 1+ on the right side and 1+ on the left side.      Brachioradialis reflexes are 1+ on the right side and 1+ on the left side.      Patellar reflexes are 1+ on the right side and 1+ on the left side.      Achilles reflexes are 1+ on the left side. Skin: Skin is warm and dry. No rash noted. She is not diaphoretic. No erythema. No pallor.  Psychiatric:  She has a normal mood and affect. Her behavior is normal. Judgment and thought content normal.        Lab Results  Component Value Date   WBC 11.5* 01/16/2010   HGB 13.7 01/16/2010   HCT  40.0 01/16/2010   PLT 325 01/16/2010   CHOL 249* 10/17/2009   TRIG 248.0* 10/17/2009   HDL 47.50 10/17/2009   LDLDIRECT 160.3 10/17/2009   ALT 23 01/16/2010   AST 26 01/16/2010   NA 142 01/16/2010   K 3.8 01/16/2010   CL 103 01/16/2010   CREATININE 0.82 01/16/2010   BUN 14 01/16/2010   CO2 28 01/16/2010   TSH 1.00 01/10/2010    Assessment & Plan:

## 2010-08-28 NOTE — Assessment & Plan Note (Signed)
Since she has some stool incontinence I will check an MRI of the L-spine to look for cauda equina syndrome

## 2010-08-28 NOTE — Assessment & Plan Note (Signed)
She has abd bloating and some signs suggestive of IBS so I will start Robinul as needed

## 2010-08-28 NOTE — Patient Instructions (Signed)
Back Pain (Lumbosacral Strain) Back pain is one of the most common causes of pain. There are many causes of back pain. Most are not serious conditions.  CAUSES Your backbone (spinal column) is made up of 24 main vertebral bodies, the sacrum, and the coccyx. These are held together by muscles and tough, fibrous tissue (ligaments). Nerve roots pass through the openings between the vertebrae. A sudden move or injury to the back may cause injury to, or pressure on, these nerves. This may result in localized back pain or pain movement (radiation) into the buttocks, down the leg, and into the foot. Sharp, shooting pain from the buttock down the back of the leg (sciatica) is frequently associated with a ruptured (herniated) disc. Pain may be caused by muscle spasm alone. Your caregiver can often find the cause of your pain by the details of your symptoms and an exam. In some cases, you may need tests (such as X-rays). Your caregiver will work with you to decide if any tests are needed based on your specific exam. HOME CARE INSTRUCTIONS  Avoid an underactive lifestyle. Active exercise, as directed by your caregiver, is your greatest weapon against back pain.   Avoid hard physical activities (tennis, racquetball, water-skiing) if you are not in proper physical condition for it. This may aggravate and/or create problems.   If you have a back problem, avoid sports requiring sudden body movements. Swimming and walking are generally safer activities.   Maintain good posture.   Avoid becoming overweight (obese).   Use bed rest for only the most extreme, sudden (acute) episode. Your caregiver will help you determine how much bed rest is necessary.   For acute conditions, you may put ice on the injured area.   Put ice in a plastic bag.   Place a towel between your skin and the bag.   Leave the ice on for 20 minutes at a time, every 2 hours, or as needed.   After you are improved and more active, it may  help to apply heat for 30 minutes before activities.  See your caregiver if you are having pain that lasts longer than expected. Your caregiver can advise appropriate exercises and/or therapy if needed. With conditioning, most back problems can be avoided. SEEK IMMEDIATE MEDICAL CARE IF:  You have numbness, tingling, weakness, or problems with the use of your arms or legs.   You experience severe back pain not relieved with medicines.   There is a change in bowel or bladder control.   You have increasing pain in any area of the body, including your belly (abdomen).   You notice shortness of breath, dizziness, or feel faint.   You feel sick to your stomach (nauseous), are throwing up (vomiting), or become sweaty.   You notice discoloration of your toes or legs, or your feet get very cold.   Your back pain is getting worse.   You have an oral temperature above 100.5, not controlled by medicine.  MAKE SURE YOU:   Understand these instructions.   Will watch your condition.   Will get help right away if you are not doing well or get worse.  Document Released: 12/25/2004 Document Re-Released: 06/11/2009 [facility] Patient Information 2011 [place], [place].Irritable Bowel Syndrome (Spastic Colon) Irritable Bowel Syndrome (IBS) is caused by a disturbance of normal bowel function. Other terms used are spastic colon, mucous colitis, and irritable colon. It does not require surgery, nor does it lead to cancer. There is no cure for IBS. But  with proper diet, stress reduction, and medication, you will find that your problems (symptoms) will gradually disappear or improve. IBS is a common digestive disorder. It usually appears in late adolescence or early adulthood. Women develop it twice as often as men. CAUSES After food has been digested and absorbed in the small intestine, waste material is moved into the colon (large intestine). In the colon, water and salts are absorbed from the undigested  products coming from the small intestine. The remaining residue, or fecal material, is held for elimination. Under normal circumstances, gentle, rhythmic contractions on the bowel walls push the fecal material along the colon towards the rectum. In IBS, however, these contractions are irregular and poorly coordinated. The fecal material is either retained too long, resulting in constipation, or expelled too soon, producing diarrhea. SYMPTOMS  The most common symptom of IBS is pain. It is typically in the lower left side of the belly (abdomen). But it may occur anywhere in the abdomen. It can be felt as heartburn, backache, or even as a dull pain in the arms or shoulders. The pain comes from excessive bowel-muscle spasms and from the buildup of gas and fecal material in the colon. This pain:  Can range from sharp belly (abdominal) cramps to a dull, continuous ache.   Usually worsens soon after eating.   Is typically relieved by having a bowel movement or passing gas.  Abdominal pain is usually accompanied by constipation. But it may also produce diarrhea. The diarrhea typically occurs right after a meal or upon arising in the morning. The stools are typically soft and watery. They are often flecked with secretions (mucus). Other symptoms of IBS include:  Bloating.  Loss of appetite.   Heartburn.  Feeling sick to your stomach  (nausea).   Belching  Vomiting   Gas.  IBS may also cause a number of symptoms that are unrelated to the digestive system:  Fatigue.  Headaches.   Anxiety  Shortness of breath   Difficulty in concentrating.  Dizziness.   These symptoms tend to come and go. DIAGNOSIS The symptoms of IBS closely mimic the symptoms of other, more serious digestive disorders. So your caregiver may wish to perform a variety of additional tests to exclude these disorders. He/she wants to be certain of learning what is wrong (diagnosis). The nature and purpose of each test will be  explained to you. TREATMENT A number of medications are available to help correct bowel function and/or relieve bowel spasms and abdominal pain. Among the drugs available are:  Mild, non-irritating laxatives for severe constipation and to help restore normal bowel habits.   Specific anti-diarrheal medications to treat severe or prolonged diarrhea.   Anti-spasmodic agents to relieve intestinal cramps.   Your caregiver may also decide to treat you with a mild tranquilizer or sedative during unusually stressful periods in your life.  The important thing to remember is that if any drug is prescribed for you, make sure that you take it exactly as directed. Make sure that your caregiver knows how well it worked for you. HOME CARE INSTRUCTIONS   Avoid foods that are high in fat or oils. Some examples ZOX:[id number] cream, butter, frankfurters, sausage, and other fatty meats.   Avoid foods that have a laxative effect, such as fruit, fruit juice, and dairy products.   Cut out carbonated drinks, chewing gum, and "gassy" foods, such as beans and cabbage. This may help relieve bloating and belching.   Bran taken with plenty of liquids may  help relieve constipation.   Keep track of what foods seem to trigger your symptoms.   Avoid emotionally charged situations or circumstances that produce anxiety.   Start or continue exercising.   Get plenty of rest and sleep.  MAKE SURE YOU:   Understand these instructions.   Will watch your condition.   Will get help right away if you are not doing well or get worse.  Document Released: 03/17/2005 Document Re-Released: 08/03/2008 [facility] Patient Information 2011 [place], [place].

## 2010-09-12 ENCOUNTER — Ambulatory Visit (INDEPENDENT_AMBULATORY_CARE_PROVIDER_SITE_OTHER): Payer: Medicare PPO | Admitting: Internal Medicine

## 2010-09-12 ENCOUNTER — Encounter: Payer: Self-pay | Admitting: Internal Medicine

## 2010-09-12 DIAGNOSIS — K589 Irritable bowel syndrome without diarrhea: Secondary | ICD-10-CM

## 2010-09-12 DIAGNOSIS — M5416 Radiculopathy, lumbar region: Secondary | ICD-10-CM

## 2010-09-12 DIAGNOSIS — I10 Essential (primary) hypertension: Secondary | ICD-10-CM

## 2010-09-12 DIAGNOSIS — IMO0002 Reserved for concepts with insufficient information to code with codable children: Secondary | ICD-10-CM

## 2010-09-12 DIAGNOSIS — M5030 Other cervical disc degeneration, unspecified cervical region: Secondary | ICD-10-CM

## 2010-09-12 DIAGNOSIS — M5137 Other intervertebral disc degeneration, lumbosacral region: Secondary | ICD-10-CM

## 2010-09-12 DIAGNOSIS — F341 Dysthymic disorder: Secondary | ICD-10-CM

## 2010-09-12 NOTE — Patient Instructions (Signed)

## 2010-09-12 NOTE — Assessment & Plan Note (Signed)
There are no radicular s/s today

## 2010-09-12 NOTE — Progress Notes (Signed)
Subjective:    Patient ID: Barbara [name], female    DOB: April 18, 1960, 50 y.o.   MRN: [id number]  HPI She returns for f/up and she tells me that her abdomen feels much better with robinul. Her neck and low back pain are unchanged. The meds are working well for her.  Review of Systems  Constitutional: Negative for fever, chills, diaphoresis, activity change, appetite change, fatigue and unexpected weight change.  HENT: Positive for neck pain. Negative for sore throat, facial swelling, trouble swallowing, neck stiffness and voice change.   Eyes: Negative.   Respiratory: Negative for cough, choking, chest tightness, shortness of breath, wheezing and stridor.   Cardiovascular: Negative for chest pain, palpitations and leg swelling.  Gastrointestinal: Negative for nausea, vomiting, abdominal pain, diarrhea, constipation, blood in stool, abdominal distention, anal bleeding and rectal pain.  Genitourinary: Negative for dysuria, urgency, frequency, hematuria, flank pain, decreased urine volume, enuresis, difficulty urinating and dyspareunia.  Musculoskeletal: Positive for back pain (neck and low back pain is uncahnged, she has MRI's tomorrow). Negative for myalgias, joint swelling, arthralgias and gait problem.  Skin: Negative for color change, pallor and rash.  Neurological: Negative for dizziness, tremors, seizures, syncope, facial asymmetry, speech difficulty, weakness, light-headedness, numbness and headaches.  Hematological: Negative for adenopathy. Does not bruise/bleed easily.  Psychiatric/Behavioral: Negative for suicidal ideas, hallucinations, behavioral problems, confusion, sleep disturbance, self-injury, dysphoric mood, decreased concentration and agitation. The patient is nervous/anxious. The patient is not hyperactive.        Objective:   Physical Exam  Vitals reviewed. Constitutional: She is oriented to person, place, and time. She appears well-developed and well-nourished. No distress.    HENT:  Head: Normocephalic and atraumatic.  Right Ear: External ear normal.  Left Ear: External ear normal.  Nose: Nose normal.  Mouth/Throat: No oropharyngeal exudate.  Eyes: Conjunctivae and EOM are normal. Pupils are equal, round, and reactive to light. Right eye exhibits no discharge. Left eye exhibits no discharge. No scleral icterus.  Neck: Normal range of motion. Neck supple. No JVD present. No tracheal deviation present. No thyromegaly present.  Cardiovascular: Normal rate, regular rhythm and normal heart sounds.  Exam reveals no gallop and no friction rub.   No murmur heard. Pulmonary/Chest: Effort normal and breath sounds normal. No stridor. No respiratory distress. She has no wheezes. She has no rales. She exhibits no tenderness.  Abdominal: Soft. Bowel sounds are normal. She exhibits no distension and no mass. There is no tenderness. There is no rebound and no guarding.  Musculoskeletal: Normal range of motion. She exhibits no edema and no tenderness.       Cervical back: She exhibits normal range of motion, no tenderness, no bony tenderness, no swelling, no edema, no deformity, no pain and no spasm.       Lumbar back: Normal. She exhibits normal range of motion, no tenderness, no bony tenderness, no swelling, no edema, no deformity, no pain and no spasm.  Lymphadenopathy:    She has no cervical adenopathy.  Neurological: She is alert and oriented to person, place, and time. She has normal reflexes. She displays normal reflexes. No cranial nerve deficit. She exhibits normal muscle tone. Coordination normal.  Skin: Skin is warm and dry. No rash noted. She is not diaphoretic. No erythema. No pallor.  Psychiatric: She has a normal mood and affect. Her behavior is normal. Judgment and thought content normal.        Lab Results  Component Value Date   WBC 11.5* 01/16/2010  HGB 13.7 01/16/2010   HCT 40.0 01/16/2010   PLT 325 01/16/2010   CHOL 249* 10/17/2009   TRIG 248.0*  10/17/2009   HDL 47.50 10/17/2009   LDLDIRECT 160.3 10/17/2009   ALT 23 01/16/2010   AST 26 01/16/2010   NA 142 01/16/2010   K 3.8 01/16/2010   CL 103 01/16/2010   CREATININE 0.82 01/16/2010   BUN 14 01/16/2010   CO2 28 01/16/2010   TSH 1.00 01/10/2010    Assessment & Plan:

## 2010-09-12 NOTE — Assessment & Plan Note (Signed)
She is much improved.

## 2010-09-12 NOTE — Assessment & Plan Note (Signed)
I await the MRI results

## 2010-09-12 NOTE — Assessment & Plan Note (Signed)
No changes on this today 

## 2010-09-12 NOTE — Assessment & Plan Note (Signed)
Her BP is well controlled 

## 2010-09-12 NOTE — Assessment & Plan Note (Signed)
I await the MRI results 

## 2010-09-13 ENCOUNTER — Ambulatory Visit (HOSPITAL_COMMUNITY)
Admission: RE | Admit: 2010-09-13 | Discharge: 2010-09-13 | Disposition: A | Discharge status: Home / Self Care | Payer: Medicare PPO | Source: Ambulatory Visit | Attending: Internal Medicine | Admitting: Internal Medicine

## 2010-09-13 DIAGNOSIS — R209 Unspecified disturbances of skin sensation: Secondary | ICD-10-CM | POA: Insufficient documentation

## 2010-09-13 DIAGNOSIS — M542 Cervicalgia: Secondary | ICD-10-CM | POA: Insufficient documentation

## 2010-09-13 DIAGNOSIS — M47812 Spondylosis without myelopathy or radiculopathy, cervical region: Secondary | ICD-10-CM | POA: Insufficient documentation

## 2010-09-13 DIAGNOSIS — M545 Low back pain, unspecified: Secondary | ICD-10-CM | POA: Insufficient documentation

## 2010-09-13 DIAGNOSIS — M5412 Radiculopathy, cervical region: Secondary | ICD-10-CM

## 2010-09-13 DIAGNOSIS — M5416 Radiculopathy, lumbar region: Secondary | ICD-10-CM

## 2010-09-13 DIAGNOSIS — M5030 Other cervical disc degeneration, unspecified cervical region: Secondary | ICD-10-CM

## 2010-09-13 DIAGNOSIS — M549 Dorsalgia, unspecified: Secondary | ICD-10-CM | POA: Insufficient documentation

## 2010-09-13 DIAGNOSIS — R29898 Other symptoms and signs involving the musculoskeletal system: Secondary | ICD-10-CM | POA: Insufficient documentation

## 2010-09-13 DIAGNOSIS — M5137 Other intervertebral disc degeneration, lumbosacral region: Secondary | ICD-10-CM

## 2010-10-01 ENCOUNTER — Telehealth: Payer: Self-pay | Admitting: *Deleted

## 2010-10-01 MED ORDER — TEMAZEPAM 30 MG PO CAPS: 30.0000 mg | ORAL_CAPSULE | Freq: Every evening | ORAL | Status: DC | PRN

## 2010-10-01 MED ORDER — HYDROCODONE-ACETAMINOPHEN 7.5-750 MG PO TABS: 1.0000 | ORAL_TABLET | Freq: Four times a day (QID) | ORAL | Status: DC | PRN

## 2010-10-01 NOTE — Telephone Encounter (Signed)
Patient requesting RFs on temazepam 30 and hydrocodone. Per pt - She can not afford pain clinic.

## 2010-10-01 NOTE — Telephone Encounter (Signed)
Ok, RF X 3

## 2010-10-01 NOTE — Telephone Encounter (Signed)
Patient notified rx called in. 

## 2010-10-04 ENCOUNTER — Telehealth: Payer: Self-pay

## 2010-10-04 NOTE — Telephone Encounter (Signed)
Patient called [facility] stating that she went to pick up her rx (10/01/10) and was only given #30 of hydrocodone. Patient takes qid and usually gets #120. Called pharmacy to clarify, received VM stating that they open at 9am.

## 2010-10-15 ENCOUNTER — Encounter: Payer: Medicare PPO | Attending: Neurosurgery | Admitting: Neurosurgery

## 2010-10-15 ENCOUNTER — Ambulatory Visit: Payer: Medicare PPO | Admitting: Physical Medicine & Rehabilitation

## 2010-10-15 DIAGNOSIS — M545 Low back pain, unspecified: Secondary | ICD-10-CM

## 2010-10-15 DIAGNOSIS — M549 Dorsalgia, unspecified: Secondary | ICD-10-CM | POA: Insufficient documentation

## 2010-10-15 DIAGNOSIS — M542 Cervicalgia: Secondary | ICD-10-CM | POA: Insufficient documentation

## 2010-10-15 DIAGNOSIS — F341 Dysthymic disorder: Secondary | ICD-10-CM

## 2010-10-15 DIAGNOSIS — F411 Generalized anxiety disorder: Secondary | ICD-10-CM | POA: Insufficient documentation

## 2010-10-15 DIAGNOSIS — R209 Unspecified disturbances of skin sensation: Secondary | ICD-10-CM | POA: Insufficient documentation

## 2010-10-16 ENCOUNTER — Other Ambulatory Visit: Payer: Self-pay | Admitting: Internal Medicine

## 2010-10-16 NOTE — Assessment & Plan Note (Signed)
This is a patient of Dr. Wynn Banker, she has been seen for neck and back pain.  She is fairly new to the clinic and she had some injections with Dr. Wynn Banker, she states it worked great, but she feels like that they were billed as a surgery, and she cannot afford to come back here for followup.  She is requesting just to be seen on a p.r.n. basis and follow up with her primary care doctor, so no prescriptions were given today.  She rates her average pain at 5-7 without any change in condition.  General activity level is 5-6.  Pain is worse in the morning and in the evening.  Walking, sitting, standing tend to aggravate medication and TENS unit tends to help.  She came back today because she was told that she had to follow up here 1 more time to get the TENS unit approved for purchase.  She walks without assistance and she can walk 30 minutes at a time.  She can drive.  She does not climb steps as well. She is disabled.  REVIEW OF SYSTEMS:  Notable for difficulties described above as well as some bowel control problems, weakness, and paresthesias.  No suicidal thoughts or aberrant behaviors, weight loss, and night sweats.  There is also a problem with abdominal pain.  PAST MEDICAL HISTORY:  Unchanged.  SOCIAL HISTORY:  She is married.  She lives with her husband.  FAMILY HISTORY:  Unchanged as she was adopted.  PHYSICAL EXAMINATION:  VITAL SIGNS:  Blood pressure 127/62, pulse 78, respirations 18, O2 sats 96 on room air. MUSCULOSKELETAL:  Motor strength 5/5 including iliopsoas, quadriceps. Sensation is intact. CONSTITUTION:  She is within normal limits.  She is alert and oriented x3.  Gait is normal.  IMPRESSION: 1. Cervicalgia. 2. Low back pain. 3. Anxiety.  PLAN:  She will continue her TENS unit.  No prescriptions were given through this office today.  She wants to follow up with primary care and I think it is fine.  She will be on p.r.n. status with [place].     Neziah Braley L.  Blima Dessert Electronically Signed    RLW/MedQ D:  10/15/2010 11:42:37  T:  10/16/2010 01:04:03  Job #:  [id number]

## 2010-10-16 NOTE — Telephone Encounter (Signed)
rx faxed to pharmacy

## 2010-11-14 ENCOUNTER — Ambulatory Visit (INDEPENDENT_AMBULATORY_CARE_PROVIDER_SITE_OTHER)
Admission: RE | Admit: 2010-11-14 | Discharge: 2010-11-14 | Disposition: A | Discharge status: Home / Self Care | Payer: Medicare PPO | Source: Ambulatory Visit | Attending: Internal Medicine | Admitting: Internal Medicine

## 2010-11-14 ENCOUNTER — Other Ambulatory Visit: Payer: Self-pay | Admitting: *Deleted

## 2010-11-14 ENCOUNTER — Other Ambulatory Visit (INDEPENDENT_AMBULATORY_CARE_PROVIDER_SITE_OTHER): Payer: Medicare PPO

## 2010-11-14 ENCOUNTER — Encounter: Payer: Self-pay | Admitting: Internal Medicine

## 2010-11-14 ENCOUNTER — Ambulatory Visit (INDEPENDENT_AMBULATORY_CARE_PROVIDER_SITE_OTHER): Payer: Medicare PPO | Admitting: Internal Medicine

## 2010-11-14 VITALS — BP 130/74 | HR 80 | Temp 98.4°F | Resp 16 | Wt 125.0 lb

## 2010-11-14 DIAGNOSIS — R079 Chest pain, unspecified: Secondary | ICD-10-CM

## 2010-11-14 DIAGNOSIS — I10 Essential (primary) hypertension: Secondary | ICD-10-CM

## 2010-11-14 LAB — CARDIAC PANEL: CK-MB: 0.9 ng/mL (ref 0.3–4.0)

## 2010-11-14 NOTE — Progress Notes (Signed)
Subjective:    Patient ID: Barbara [name], female    DOB: 02/28/1961, 50 y.o.   MRN: [id number]  Chest Pain  This is a new problem. The current episode started today. The onset quality is sudden. The problem occurs intermittently. The problem has been gradually improving. The pain is present in the substernal region. The pain is at a severity of 1/10. The pain is mild. The quality of the pain is described as crushing. The pain radiates to the mid back. Pertinent negatives include no abdominal pain, back pain, claudication, cough, diaphoresis, dizziness, exertional chest pressure, fever, headaches, hemoptysis, irregular heartbeat, leg pain, lower extremity edema, malaise/fatigue, nausea, near-syncope, numbness, orthopnea, palpitations, PND, shortness of breath, sputum production, syncope, vomiting or weakness. The pain is aggravated by nothing. She has tried nothing for the symptoms. Risk factors include smoking/tobacco exposure and post-menopausal.  Pertinent negatives for past medical history include no seizures.      Review of Systems  Constitutional: Negative for fever, chills, malaise/fatigue, diaphoresis, activity change, appetite change, fatigue and unexpected weight change.  HENT: Negative for sore throat, facial swelling, trouble swallowing, neck pain and voice change.   Eyes: Negative for photophobia, redness and visual disturbance.  Respiratory: Negative for apnea, cough, hemoptysis, sputum production, choking, chest tightness, shortness of breath, wheezing and stridor.   Cardiovascular: Positive for chest pain. Negative for palpitations, orthopnea, claudication, leg swelling, syncope, PND and near-syncope.  Gastrointestinal: Negative for nausea, vomiting, abdominal pain, diarrhea, constipation and abdominal distention.  Genitourinary: Negative for dysuria, urgency, frequency, hematuria, flank pain, enuresis, difficulty urinating and dyspareunia.  Musculoskeletal: Negative for myalgias,  back pain, joint swelling, arthralgias and gait problem.  Skin: Negative for color change, pallor, rash and wound.  Neurological: Negative for dizziness, tremors, seizures, syncope, facial asymmetry, speech difficulty, weakness, light-headedness, numbness and headaches.  Hematological: Negative for adenopathy. Does not bruise/bleed easily.  Psychiatric/Behavioral: Negative for suicidal ideas, hallucinations, behavioral problems, confusion, sleep disturbance, self-injury, dysphoric mood, decreased concentration and agitation. The patient is nervous/anxious. The patient is not hyperactive.        Objective:   Physical Exam  Vitals reviewed. Constitutional: She is oriented to person, place, and time. She appears well-developed and well-nourished. No distress.  HENT:  Head: Normocephalic and atraumatic.  Eyes: Conjunctivae and EOM are normal. Pupils are equal, round, and reactive to light. Right eye exhibits no discharge. Left eye exhibits no discharge. No scleral icterus.  Neck: Normal range of motion. Neck supple. No JVD present. No tracheal deviation present. No thyromegaly present.  Cardiovascular: Normal rate, regular rhythm, normal heart sounds and intact distal pulses.  PMI is not displaced.  Exam reveals no gallop, no S3, no S4, no distant heart sounds and no friction rub.   No murmur heard.  No systolic murmur is present   No diastolic murmur is present  Pulses:      Carotid pulses are 1+ on the right side, and 1+ on the left side.      Radial pulses are 1+ on the right side, and 1+ on the left side.       Femoral pulses are 1+ on the right side, and 1+ on the left side.      Popliteal pulses are 1+ on the right side, and 1+ on the left side.       Dorsalis pedis pulses are 1+ on the right side, and 1+ on the left side.       Posterior tibial pulses are 1+ on the right side,  and 1+ on the left side.  Pulmonary/Chest: Effort normal and breath sounds normal. No stridor. No respiratory  distress. She has no wheezes. She has no rales. She exhibits no tenderness.  Abdominal: Soft. Bowel sounds are normal. She exhibits no distension and no mass. There is no tenderness. There is no rebound and no guarding.  Musculoskeletal: Normal range of motion. She exhibits no edema and no tenderness.  Lymphadenopathy:    She has no cervical adenopathy.  Neurological: She is alert and oriented to person, place, and time. She has normal reflexes. No cranial nerve deficit. She exhibits normal muscle tone. Coordination normal.  Skin: Skin is warm and dry. No rash noted. She is not diaphoretic. No erythema. No pallor.  Psychiatric: She has a normal mood and affect. Her behavior is normal. Judgment and thought content normal.          Assessment & Plan:

## 2010-11-14 NOTE — Assessment & Plan Note (Signed)
Her BP is well controlled 

## 2010-11-14 NOTE — Patient Instructions (Signed)
Chest Pain (Nonspecific) It is often hard to give a specific diagnosis for the cause of chest pain. There is always a chance that your pain could be related to something serious, such as a heart attack or a blood clot in the lungs. You need to follow up with your caregiver for further evaluation. CAUSES  Heartburn.   Pneumonia or bronchitis.   Anxiety and stress.   Inflammation around your heart (pericarditis) or lung (pleuritis or pleurisy).   A blood clot in the lung.   A collapsed lung (pneumothorax). It can develop suddenly on its own (spontaneous pneumothorax) or from injury (trauma) to the chest.  The chest wall is composed of bones, muscles, and cartilage. Any of these can be the source of the pain.  The bones can be bruised by injury.   The muscles or cartilage can be strained by coughing or overwork.   The cartilage can be affected by inflammation and become sore (costochondritis).  DIAGNOSIS Lab tests or other studies, such as X-rays, an EKG, stress testing, or cardiac imaging, may be needed to find the cause of your pain.  TREATMENT  Treatment depends on what may be causing your chest pain. Treatment may include:   Acid blockers for heartburn.  Anti-inflammatory medicine.   Pain medicine for inflammatory conditions.  Antibiotics if an infection is present.    You may be advised to change lifestyle habits. This includes stopping smoking and avoiding caffeine and chocolate.   You may be advised to keep your head raised (elevated) when sleeping. This reduces the chance of acid going backward from your stomach into your esophagus.   Most of the time, nonspecific chest pain will improve within 2 to 3 days with rest and mild pain medicine.  HOME CARE INSTRUCTIONS  If antibiotics were prescribed, take the full amount even if you start to feel better.   For the next few days, avoid physical activities that bring on chest pain. Continue physical activities as directed.     Do not smoke cigarettes or drink alcohol until your symptoms are gone.   Only take over-the-counter or prescription medicine for pain, discomfort, or fever as directed by your caregiver.   Follow your caregiver's suggestions for further testing if your chest pain does not go away.   Keep any follow-up appointments you made. If you do not go to an appointment, you could develop lasting (chronic) problems with pain. If there is any problem keeping an appointment, you must call to reschedule.  SEEK MEDICAL CARE IF:  You think you are having problems from the medicine you are taking. Read your medicine instructions carefully.   Your chest pain does not go away, even after treatment.   You develop a rash with blisters on your chest.  SEEK IMMEDIATE MEDICAL CARE IF:  You have increased chest pain or pain that spreads to your arm, neck, jaw, back, or belly (abdomen).   You develop shortness of breath, an increasing cough, or you are coughing up blood.   You have severe back or abdominal pain, feel sick to your stomach (nauseous) or throw up (vomit).   You develop severe weakness, fainting, or chills.   You have an oral temperature above 100.5, not controlled by medicine.  THIS IS AN EMERGENCY. Do not wait to see if the pain will go away. Get medical help at once. Call your local emergency services   (911 in [place] Do not drive yourself to the hospital. MAKE SURE YOU:  Understand   these instructions.   Will watch your condition.   Will get help right away if you are not doing well or get worse.  Document Released: 12/25/2004 Document Re-Released: 06/11/2009 [employer] Patient Information 2011 [employer]. 

## 2010-11-14 NOTE — Assessment & Plan Note (Signed)
Her EKG is normal, I will check a cxr to look for structural problems and will check a d-dimer to look for PE and a cardiac panel to see if she has any elevated enzymes, I think this is low risk chest pain in light of it starting at rest and having no exertional components and no other s/s

## 2010-11-15 LAB — D-DIMER, QUANTITATIVE: D-Dimer, Quant: 0.23 ug/mL-FEU (ref 0.00–0.48)

## 2010-11-22 ENCOUNTER — Ambulatory Visit (INDEPENDENT_AMBULATORY_CARE_PROVIDER_SITE_OTHER): Payer: Medicare PPO | Admitting: Internal Medicine

## 2010-11-22 ENCOUNTER — Encounter: Payer: Self-pay | Admitting: Internal Medicine

## 2010-11-22 DIAGNOSIS — I10 Essential (primary) hypertension: Secondary | ICD-10-CM

## 2010-11-22 DIAGNOSIS — M549 Dorsalgia, unspecified: Secondary | ICD-10-CM

## 2010-11-22 NOTE — Progress Notes (Signed)
Subjective:    Patient ID: Barbara [name], female    DOB: 02/28/1961, 50 y.o.   MRN: [id number]  Hypertension This is a chronic problem. The current episode started more than 1 year ago. The problem has been gradually improving since onset. The problem is controlled. Associated symptoms include anxiety and neck pain (chronic and unchanged). Pertinent negatives include no blurred vision, chest pain, headaches, orthopnea, palpitations, peripheral edema, PND, shortness of breath or sweats. There are no associated agents to hypertension. Past treatments include ACE inhibitors. The current treatment provides moderate improvement. Compliance problems include exercise and diet.       Review of Systems  Constitutional: Negative for fever, chills, diaphoresis, activity change, appetite change, fatigue and unexpected weight change.  HENT: Positive for neck pain (chronic and unchanged). Negative for sore throat, facial swelling, trouble swallowing, neck stiffness and voice change.   Eyes: Negative for blurred vision, photophobia, redness and visual disturbance.  Respiratory: Negative for apnea, cough, choking, chest tightness, shortness of breath, wheezing and stridor.   Cardiovascular: Negative for chest pain, palpitations, orthopnea, leg swelling and PND.  Gastrointestinal: Negative for nausea, vomiting, abdominal pain, diarrhea, constipation and anal bleeding.  Genitourinary: Negative for dysuria, urgency, frequency, hematuria, flank pain, decreased urine volume, enuresis, difficulty urinating, pelvic pain and dyspareunia.  Musculoskeletal: Positive for back pain (chronic and unchanged). Negative for myalgias, joint swelling, arthralgias and gait problem.  Skin: Negative for color change, pallor, rash and wound.  Neurological: Negative for dizziness, tremors, seizures, syncope, facial asymmetry, speech difficulty, weakness, light-headedness, numbness and headaches.  Hematological: Negative for adenopathy.  Does not bruise/bleed easily.  Psychiatric/Behavioral: Negative for suicidal ideas, hallucinations, behavioral problems, confusion, sleep disturbance, self-injury, dysphoric mood, decreased concentration and agitation. The patient is not nervous/anxious and is not hyperactive.        Objective:   Physical Exam  Vitals reviewed. Constitutional: She is oriented to person, place, and time. She appears well-developed and well-nourished. No distress.  HENT:  Head: Normocephalic and atraumatic.  Nose: Nose normal.  Mouth/Throat: No oropharyngeal exudate.  Eyes: Conjunctivae and EOM are normal. Pupils are equal, round, and reactive to light. Right eye exhibits no discharge. Left eye exhibits no discharge. No scleral icterus.  Neck: Normal range of motion. Neck supple. No JVD present. No tracheal deviation present. No thyromegaly present.  Cardiovascular: Normal rate, regular rhythm, normal heart sounds and intact distal pulses.  Exam reveals no gallop and no friction rub.   No murmur heard. Pulmonary/Chest: Effort normal and breath sounds normal. No stridor. No respiratory distress. She has no wheezes. She has no rales. She exhibits no tenderness.  Abdominal: Soft. Bowel sounds are normal. She exhibits no distension and no mass. There is no tenderness. There is no rebound and no guarding.  Musculoskeletal: Normal range of motion. She exhibits no edema and no tenderness.  Lymphadenopathy:    She has no cervical adenopathy.  Neurological: She is alert and oriented to person, place, and time. She has normal reflexes. She displays normal reflexes. No cranial nerve deficit. She exhibits normal muscle tone. Coordination normal.  Skin: Skin is warm and dry. No rash noted. She is not diaphoretic. No erythema. No pallor.  Psychiatric: She has a normal mood and affect. Her behavior is normal. Judgment and thought content normal.      Lab Results  Component Value Date   WBC 11.5* 01/16/2010   HGB 13.7  01/16/2010   HCT 40.0 01/16/2010   PLT 325 01/16/2010   CHOL 249* 10/17/2009  TRIG 248.0* 10/17/2009   HDL 47.50 10/17/2009   LDLDIRECT 160.3 10/17/2009   ALT 23 01/16/2010   AST 26 01/16/2010   NA 142 01/16/2010   K 3.8 01/16/2010   CL 103 01/16/2010   CREATININE 0.82 01/16/2010   BUN 14 01/16/2010   CO2 28 01/16/2010   TSH 1.00 01/10/2010      Assessment & Plan:

## 2010-11-24 ENCOUNTER — Other Ambulatory Visit: Payer: Self-pay | Admitting: Internal Medicine

## 2010-11-25 ENCOUNTER — Encounter: Payer: Self-pay | Admitting: Internal Medicine

## 2010-11-25 NOTE — Assessment & Plan Note (Signed)
No changes, continue current meds.

## 2010-11-25 NOTE — Patient Instructions (Signed)

## 2010-11-25 NOTE — Assessment & Plan Note (Signed)
No changes, BP is well controlled

## 2011-01-16 ENCOUNTER — Ambulatory Visit (INDEPENDENT_AMBULATORY_CARE_PROVIDER_SITE_OTHER)
Admission: RE | Admit: 2011-01-16 | Discharge: 2011-01-16 | Disposition: A | Discharge status: Home / Self Care | Payer: Medicare PPO | Source: Ambulatory Visit | Attending: Internal Medicine | Admitting: Internal Medicine

## 2011-01-16 ENCOUNTER — Ambulatory Visit (INDEPENDENT_AMBULATORY_CARE_PROVIDER_SITE_OTHER): Payer: Medicare PPO | Admitting: Internal Medicine

## 2011-01-16 ENCOUNTER — Encounter: Payer: Self-pay | Admitting: Internal Medicine

## 2011-01-16 ENCOUNTER — Other Ambulatory Visit (INDEPENDENT_AMBULATORY_CARE_PROVIDER_SITE_OTHER): Payer: Medicare PPO

## 2011-01-16 VITALS — BP 108/70 | HR 62 | Temp 97.4°F | Resp 16 | Wt 127.0 lb

## 2011-01-16 DIAGNOSIS — R108A1 Right flank tenderness: Secondary | ICD-10-CM | POA: Insufficient documentation

## 2011-01-16 DIAGNOSIS — K589 Irritable bowel syndrome without diarrhea: Secondary | ICD-10-CM

## 2011-01-16 DIAGNOSIS — R10819 Abdominal tenderness, unspecified site: Secondary | ICD-10-CM

## 2011-01-16 DIAGNOSIS — I10 Essential (primary) hypertension: Secondary | ICD-10-CM

## 2011-01-16 LAB — URINALYSIS, ROUTINE W REFLEX MICROSCOPIC
Bilirubin Urine: NEGATIVE
Nitrite: NEGATIVE
Total Protein, Urine: NEGATIVE

## 2011-01-16 LAB — CBC WITH DIFFERENTIAL/PLATELET
Basophils Absolute: 0.1 10*3/uL (ref 0.0–0.1)
Basophils Relative: 0.8 % (ref 0.0–3.0)
Eosinophils Absolute: 0.3 10*3/uL (ref 0.0–0.7)
HCT: 35.9 % — ABNORMAL LOW (ref 36.0–46.0)
Hemoglobin: 12.2 g/dL (ref 12.0–15.0)
Lymphs Abs: 2.9 10*3/uL (ref 0.7–4.0)
MCHC: 34 g/dL (ref 30.0–36.0)
Neutro Abs: 4.6 10*3/uL (ref 1.4–7.7)
RBC: 3.85 Mil/uL — ABNORMAL LOW (ref 3.87–5.11)
RDW: 13.3 % (ref 11.5–14.6)

## 2011-01-16 LAB — COMPREHENSIVE METABOLIC PANEL
ALT: 18 U/L (ref 0–35)
AST: 17 U/L (ref 0–37)
BUN: 12 mg/dL (ref 6–23)
CO2: 29 mEq/L (ref 19–32)
Calcium: 9.5 mg/dL (ref 8.4–10.5)
Chloride: 105 mEq/L (ref 96–112)
Creatinine, Ser: 0.7 mg/dL (ref 0.4–1.2)
GFR: 89.12 mL/min (ref >60.00)
Total Bilirubin: 0.6 mg/dL (ref 0.3–1.2)

## 2011-01-16 LAB — LIPASE: Lipase: 29 U/L (ref 11.0–59.0)

## 2011-01-16 NOTE — Assessment & Plan Note (Signed)
Continue robinul prn

## 2011-01-16 NOTE — Progress Notes (Signed)
Subjective:    Patient ID: Barbara [name], female    DOB: 1960/11/09, 50 y.o.   MRN: [id number]  Abdominal Pain This is a recurrent problem. The current episode started in the past 7 days. The onset quality is sudden. The problem occurs intermittently. The problem has been gradually improving. The pain is located in the right flank. The pain is at a severity of 2/10. The pain is mild. The quality of the pain is sharp. The abdominal pain does not radiate. Associated symptoms include frequency and nausea. Pertinent negatives include no anorexia, arthralgias, belching, constipation, diarrhea, dysuria, fever, flatus, headaches, hematochezia, hematuria, melena, myalgias, vomiting or weight loss. The pain is aggravated by nothing. Relieved by: robinul. Treatments tried: robinul. The treatment provided moderate relief. Her past medical history is significant for irritable bowel syndrome.      Review of Systems  Constitutional: Negative for fever, chills, weight loss, diaphoresis, activity change, appetite change, fatigue and unexpected weight change.  HENT: Negative.   Eyes: Negative.   Respiratory: Negative for apnea, cough, choking, chest tightness, shortness of breath, wheezing and stridor.   Cardiovascular: Negative for chest pain, palpitations and leg swelling.  Gastrointestinal: Positive for nausea and abdominal pain. Negative for vomiting, diarrhea, constipation, blood in stool, melena, hematochezia, abdominal distention, anal bleeding, rectal pain, anorexia and flatus.  Genitourinary: Positive for frequency. Negative for dysuria, urgency, hematuria, flank pain, decreased urine volume, vaginal bleeding, vaginal discharge, enuresis, difficulty urinating, genital sores, vaginal pain, menstrual problem, pelvic pain and dyspareunia.  Musculoskeletal: Negative.  Negative for myalgias, back pain, arthralgias and gait problem.  Skin: Negative for color change, pallor, rash and wound.  Neurological:  Negative for dizziness, tremors, seizures, syncope, facial asymmetry, speech difficulty, weakness, light-headedness, numbness and headaches.  Hematological: Negative for adenopathy. Does not bruise/bleed easily.  Psychiatric/Behavioral: Negative.        Objective:   Physical Exam  Vitals reviewed. Constitutional: She is oriented to person, place, and time. She appears well-developed and well-nourished. No distress.  HENT:  Head: Normocephalic and atraumatic.  Mouth/Throat: Oropharynx is clear and moist. No oropharyngeal exudate.  Eyes: Conjunctivae are normal. Right eye exhibits no discharge. Left eye exhibits no discharge. No scleral icterus.  Neck: Normal range of motion. Neck supple. No JVD present. No tracheal deviation present. No thyromegaly present.  Cardiovascular: Normal rate, regular rhythm, normal heart sounds and intact distal pulses.  Exam reveals no gallop and no friction rub.   No murmur heard. Pulmonary/Chest: Effort normal and breath sounds normal. No stridor. No respiratory distress. She has no wheezes. She has no rales. She exhibits no tenderness.  Abdominal: Soft. Bowel sounds are normal. She exhibits no shifting dullness, no distension, no pulsatile liver, no fluid wave, no abdominal bruit, no ascites, no pulsatile midline mass and no mass. There is tenderness in the right upper quadrant. There is no rigidity, no rebound, no guarding, no CVA tenderness, no tenderness at McBurney's point and negative Murphy's sign. No hernia. Hernia confirmed negative in the ventral area.  Musculoskeletal: Normal range of motion. She exhibits no edema and no tenderness.  Lymphadenopathy:    She has no cervical adenopathy.  Neurological: She is oriented to person, place, and time. She displays normal reflexes. She exhibits normal muscle tone.  Skin: Skin is warm and dry. No rash noted. She is not diaphoretic. No erythema. No pallor.  Psychiatric: She has a normal mood and affect. Her  behavior is normal. Judgment and thought content normal.      Lab  Results  Component Value Date   WBC 11.5* 01/16/2010   HGB 13.7 01/16/2010   HCT 40.0 01/16/2010   PLT 325 01/16/2010   GLUCOSE 109* 01/16/2010   CHOL 249* 10/17/2009   TRIG 248.0* 10/17/2009   HDL 47.50 10/17/2009   LDLDIRECT 160.3 10/17/2009   ALT 23 01/16/2010   AST 26 01/16/2010   NA 142 01/16/2010   K 3.8 01/16/2010   CL 103 01/16/2010   CREATININE 0.82 01/16/2010   BUN 14 01/16/2010   CO2 28 01/16/2010   TSH 1.00 01/10/2010      Assessment & Plan:

## 2011-01-16 NOTE — Assessment & Plan Note (Signed)
Her BP is well controlled 

## 2011-01-16 NOTE — Assessment & Plan Note (Signed)
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

## 2011-01-16 NOTE — Patient Instructions (Signed)

## 2011-02-17 ENCOUNTER — Encounter: Payer: Self-pay | Admitting: Internal Medicine

## 2011-02-17 ENCOUNTER — Ambulatory Visit (INDEPENDENT_AMBULATORY_CARE_PROVIDER_SITE_OTHER): Payer: Medicare PPO | Admitting: Internal Medicine

## 2011-02-17 DIAGNOSIS — M549 Dorsalgia, unspecified: Secondary | ICD-10-CM

## 2011-02-17 DIAGNOSIS — I10 Essential (primary) hypertension: Secondary | ICD-10-CM

## 2011-02-17 MED ORDER — NAPROXEN-ESOMEPRAZOLE 500-20 MG PO TBEC: 1.0000 | DELAYED_RELEASE_TABLET | Freq: Two times a day (BID) | ORAL | Status: DC | PRN

## 2011-02-17 NOTE — Assessment & Plan Note (Signed)
She has had a recent flare of LBP and she had run out of vimovo so I gave her more samples of vimovo today

## 2011-02-17 NOTE — Patient Instructions (Signed)
Back Pain, Adult Low back pain is very common. About 1 in 5 people have back pain.The cause of low back pain is rarely dangerous. The pain often gets better over time.About half of people with a sudden onset of back pain feel better in just 2 weeks. About 8 in 10 people feel better by 6 weeks.  CAUSES Some common causes of back pain include:  Strain of the muscles or ligaments supporting the spine.   Wear and tear (degeneration) of the spinal discs.   Arthritis.   Direct injury to the back.  DIAGNOSIS Most of the time, the direct cause of low back pain is not known.However, back pain can be treated effectively even when the exact cause of the pain is unknown.Answering your caregiver's questions about your overall health and symptoms is one of the most accurate ways to make sure the cause of your pain is not dangerous. If your caregiver needs more information, he or she may order lab work or imaging tests (X-rays or MRIs).However, even if imaging tests show changes in your back, this usually does not require surgery. HOME CARE INSTRUCTIONS For many people, back pain returns.Since low back pain is rarely dangerous, it is often a condition that people can learn to manageon their own.   Remain active. It is stressful on the back to sit or stand in one place. Do not sit, drive, or stand in one place for more than 30 minutes at a time. Take short walks on level surfaces as soon as pain allows.Try to increase the length of time you walk each day.   Do not stay in bed.Resting more than 1 or 2 days can delay your recovery.   Do not avoid exercise or work.Your body is made to move.It is not dangerous to be active, even though your back may hurt.Your back will likely heal faster if you return to being active before your pain is gone.   Pay attention to your body when you bend and lift. Many people have less discomfortwhen lifting if they bend their knees, keep the load close to their  bodies,and avoid twisting. Often, the most comfortable positions are those that put less stress on your recovering back.   Find a comfortable position to sleep. Use a firm mattress and lie on your side with your knees slightly bent. If you lie on your back, put a pillow under your knees.   Only take over-the-counter or prescription medicines as directed by your caregiver. Over-the-counter medicines to reduce pain and inflammation are often the most helpful.Your caregiver may prescribe muscle relaxant drugs.These medicines help dull your pain so you can more quickly return to your normal activities and healthy exercise.   Put ice on the injured area.   Put ice in a plastic bag.   Place a towel between your skin and the bag.   Leave the ice on for 15 to 20 minutes, 3 to 4 times a day for the first 2 to 3 days. After that, ice and heat may be alternated to reduce pain and spasms.   Ask your caregiver about trying back exercises and gentle massage. This may be of some benefit.   Avoid feeling anxious or stressed.Stress increases muscle tension and can worsen back pain.It is important to recognize when you are anxious or stressed and learn ways to manage it.Exercise is a great option.  SEEK MEDICAL CARE IF:  You have pain that is not relieved with rest or medicine.   You have   pain that does not improve in 1 week.   You have new symptoms.   You are generally not feeling well.  SEEK IMMEDIATE MEDICAL CARE IF:   You have pain that radiates from your back into your legs.   You develop new bowel or bladder control problems.   You have unusual weakness or numbness in your arms or legs.   You develop nausea or vomiting.   You develop abdominal pain.   You feel faint.  Document Released: 03/17/2005 Document Revised: 11/27/2010 Document Reviewed: 08/05/2010 [employer] Patient Information 2012 [employer]. 

## 2011-02-17 NOTE — Assessment & Plan Note (Signed)
Her BP is well controlled, recent lytes look good

## 2011-02-17 NOTE — Progress Notes (Signed)
Subjective:    Patient ID: Barbara [name], female    DOB: [birth year], 50 y.o.   MRN: [id number]  Back Pain This is a chronic problem. The current episode started more than 1 year ago. The problem occurs intermittently. The problem is unchanged. The pain is present in the lumbar spine. The quality of the pain is described as aching. The pain does not radiate. The pain is at a severity of 6/10. The pain is mild. The pain is worse during the day. The symptoms are aggravated by bending. Stiffness is present in the morning. Pertinent negatives include no abdominal pain, bladder incontinence, bowel incontinence, chest pain, dysuria, fever, headaches, leg pain, numbness, paresis, paresthesias, pelvic pain, perianal numbness, tingling, weakness or weight loss. Risk factors include lack of exercise, obesity and poor posture. She has tried NSAIDs and analgesics for the symptoms. The treatment provided significant relief.  Hypertension This is a chronic problem. The current episode started more than 1 year ago. The problem has been gradually improving since onset. The problem is controlled. Pertinent negatives include no anxiety, blurred vision, chest pain, headaches, malaise/fatigue, neck pain, orthopnea, palpitations, peripheral edema, PND, shortness of breath or sweats. There are no associated agents to hypertension. Past treatments include ACE inhibitors. The current treatment provides significant improvement. Compliance problems include exercise and diet.       Review of Systems  Constitutional: Negative for fever, chills, weight loss, malaise/fatigue, diaphoresis, activity change, appetite change, fatigue and unexpected weight change.  HENT: Negative.  Negative for neck pain.   Eyes: Negative.  Negative for blurred vision.  Respiratory: Negative for apnea, cough, choking, chest tightness, shortness of breath, wheezing and stridor.   Cardiovascular: Negative for chest pain, palpitations, orthopnea, leg  swelling and PND.  Gastrointestinal: Negative for nausea, vomiting, abdominal pain, diarrhea, constipation, blood in stool and bowel incontinence.  Genitourinary: Negative.  Negative for bladder incontinence, dysuria and pelvic pain.  Musculoskeletal: Positive for back pain. Negative for myalgias, joint swelling, arthralgias and gait problem.  Skin: Negative for color change, pallor, rash and wound.  Neurological: Negative for dizziness, tingling, tremors, seizures, syncope, facial asymmetry, speech difficulty, weakness, light-headedness, numbness, headaches and paresthesias.  Hematological: Negative for adenopathy. Does not bruise/bleed easily.  Psychiatric/Behavioral: Negative.        Objective:   Physical Exam  Vitals reviewed. Constitutional: She is oriented to person, place, and time. She appears well-developed and well-nourished. No distress.  HENT:  Head: Normocephalic and atraumatic.  Mouth/Throat: Oropharynx is clear and moist. No oropharyngeal exudate.  Eyes: Conjunctivae are normal. Right eye exhibits no discharge. Left eye exhibits no discharge. No scleral icterus.  Neck: Normal range of motion. Neck supple. No JVD present. No tracheal deviation present. No thyromegaly present.  Cardiovascular: Normal rate, regular rhythm, normal heart sounds and intact distal pulses.  Exam reveals no gallop and no friction rub.   No murmur heard. Pulmonary/Chest: Effort normal and breath sounds normal. No stridor. No respiratory distress. She has no wheezes. She has no rales. She exhibits no tenderness.  Abdominal: Soft. Bowel sounds are normal. She exhibits no distension and no mass. There is no tenderness. There is no rebound and no guarding.  Musculoskeletal: Normal range of motion. She exhibits no edema and no tenderness.       Lumbar back: Normal. She exhibits normal range of motion, no tenderness, no bony tenderness, no swelling, no edema, no deformity, no laceration, no pain, no spasm and  normal pulse.  Lymphadenopathy:    She has  no cervical adenopathy.  Neurological: She is oriented to person, place, and time.  Skin: Skin is warm and dry. No rash noted. She is not diaphoretic. No erythema. No pallor.  Psychiatric: She has a normal mood and affect. Her behavior is normal. Judgment and thought content normal.      Lab Results  Component Value Date   WBC 8.3 01/16/2011   HGB 12.2 01/16/2011   HCT 35.9* 01/16/2011   PLT 279.0 01/16/2011   GLUCOSE 75 01/16/2011   CHOL 249* 10/17/2009   TRIG 248.0* 10/17/2009   HDL 47.50 10/17/2009   LDLDIRECT 160.3 10/17/2009   ALT 18 01/16/2011   AST 17 01/16/2011   NA 142 01/16/2011   K 4.3 01/16/2011   CL 105 01/16/2011   CREATININE 0.7 01/16/2011   BUN 12 01/16/2011   CO2 29 01/16/2011   TSH 1.00 01/10/2010      Assessment & Plan:

## 2011-02-21 ENCOUNTER — Telehealth: Payer: Self-pay

## 2011-02-21 NOTE — Telephone Encounter (Signed)
Received fax from pharmacy stating that insurance will not cover Vimovo 500-20mg  w/o a prior auth. Must call [phone].

## 2011-03-14 ENCOUNTER — Other Ambulatory Visit: Payer: Self-pay | Admitting: Internal Medicine

## 2011-04-19 ENCOUNTER — Other Ambulatory Visit: Payer: Self-pay | Admitting: Internal Medicine

## 2011-04-21 ENCOUNTER — Encounter: Payer: Self-pay | Admitting: Internal Medicine

## 2011-04-21 ENCOUNTER — Ambulatory Visit (INDEPENDENT_AMBULATORY_CARE_PROVIDER_SITE_OTHER): Payer: Medicare PPO | Admitting: Internal Medicine

## 2011-04-21 DIAGNOSIS — F341 Dysthymic disorder: Secondary | ICD-10-CM

## 2011-04-21 DIAGNOSIS — I10 Essential (primary) hypertension: Secondary | ICD-10-CM

## 2011-04-21 DIAGNOSIS — M549 Dorsalgia, unspecified: Secondary | ICD-10-CM

## 2011-04-21 NOTE — Progress Notes (Signed)
  Subjective:    Patient ID: Barbara [name], female    DOB: 1961-01-23, 51 y.o.   MRN: [id number]  HPI  She returns for f/up and she tells me that she had to put down a 32 year old dog of hers today b/c it had developed liver failure. The dog has been sick for about 1-2 weeks and during that time Patryce began to feel ill with nausea and loss of appetite. She has also been having crying spells.  Review of Systems  Constitutional: Negative.   HENT: Negative.   Eyes: Negative.   Respiratory: Negative.   Cardiovascular: Negative.   Gastrointestinal: Negative.   Genitourinary: Negative.   Musculoskeletal: Negative.   Skin: Negative.   Neurological: Negative.   Hematological: Negative.   Psychiatric/Behavioral: Positive for sleep disturbance, dysphoric mood and decreased concentration. Negative for suicidal ideas, hallucinations, behavioral problems, confusion, self-injury and agitation. The patient is nervous/anxious. The patient is not hyperactive.        Objective:   Physical Exam  Vitals reviewed. Constitutional: She is oriented to person, place, and time. She appears well-developed and well-nourished. No distress.  HENT:  Head: Normocephalic and atraumatic.  Mouth/Throat: Oropharynx is clear and moist. No oropharyngeal exudate.  Eyes: Conjunctivae are normal. Right eye exhibits no discharge. Left eye exhibits no discharge. No scleral icterus.  Neck: Normal range of motion. Neck supple. No JVD present. No tracheal deviation present. No thyromegaly present.  Cardiovascular: Normal rate, regular rhythm, normal heart sounds and intact distal pulses.  Exam reveals no gallop and no friction rub.   No murmur heard. Pulmonary/Chest: Effort normal and breath sounds normal. No stridor. No respiratory distress. She has no wheezes. She has no rales. She exhibits no tenderness.  Abdominal: Soft. Bowel sounds are normal. She exhibits no distension and no mass. There is no tenderness. There is no  rebound and no guarding.  Musculoskeletal: Normal range of motion. She exhibits no edema and no tenderness.  Lymphadenopathy:    She has no cervical adenopathy.  Neurological: She is oriented to person, place, and time.  Skin: Skin is warm and dry. No rash noted. She is not diaphoretic. No erythema. No pallor.  Psychiatric: Her speech is normal and behavior is normal. Judgment and thought content normal. Her mood appears anxious. Her affect is not angry, not blunt, not labile and not inappropriate. She is not agitated, not aggressive, is not hyperactive, not slowed, not withdrawn, not actively hallucinating and not combative. Thought content is not paranoid and not delusional. Cognition and memory are normal. Cognition and memory are not impaired. She does not express impulsivity or inappropriate judgment. She exhibits a depressed mood (tearful). She expresses no homicidal and no suicidal ideation. She expresses no suicidal plans and no homicidal plans. She exhibits normal recent memory and normal remote memory. She is attentive.          Assessment & Plan:

## 2011-04-21 NOTE — Assessment & Plan Note (Signed)
No change 

## 2011-04-21 NOTE — Assessment & Plan Note (Signed)
She was encouraged to work thru the grief process and to take her meds as usual

## 2011-04-21 NOTE — Patient Instructions (Signed)
Grief Reaction Grief is a normal response to the death of someone close to you. Feelings of fear, anger, and guilt can affect almost everyone who loses someone they love. Symptoms of depression are also common. These include problems with sleep, loss of appetite, and lack of energy. These grief reaction symptoms often last for weeks to months after a loss. They may also return during special times that remind you of the person you lost, such as an anniversary or birthday. Anxiety, insomnia, irritability, and deep depression may last beyond the period of normal grief. If you experience these feelings for 6 months or longer, you may have clinical depression. Clinical depression requires further medical attention. If you think that you have clinical depression, you should contact your caregiver. If you have a history of depression and or a family history of depression, you are at greater risk of clinical depression. You are also at greater risk of developing clinical depression if the loss was traumatic or the loss was of someone with whom you had unresolved issues.  A grief reaction can become complicated by being blocked. This means being unable to cry or express extreme emotions. This may prolong the grieving period and worsen the emotional effects of the loss. Mourning is a natural event in human life. A healthy grief reaction is one that is not blocked . It requires a time of sadness and readjustment.It is very important to share your sorrow and fear with others, especially close friends and family. Professional counselors and clergy can also help you process your grief. Document Released: 03/17/2005 Document Revised: 11/27/2010 Document Reviewed: 11/25/2005 [facility] Patient Information 2012 [place], [place].

## 2011-04-21 NOTE — Assessment & Plan Note (Signed)
Her BP is well controlled 

## 2011-04-22 ENCOUNTER — Other Ambulatory Visit: Payer: Self-pay | Admitting: Internal Medicine

## 2011-05-02 ENCOUNTER — Encounter: Payer: Self-pay | Admitting: Internal Medicine

## 2011-05-02 ENCOUNTER — Other Ambulatory Visit (INDEPENDENT_AMBULATORY_CARE_PROVIDER_SITE_OTHER): Payer: Medicare PPO

## 2011-05-02 ENCOUNTER — Ambulatory Visit (INDEPENDENT_AMBULATORY_CARE_PROVIDER_SITE_OTHER): Payer: Medicare PPO | Admitting: Internal Medicine

## 2011-05-02 VITALS — BP 134/82 | HR 76 | Temp 97.8°F | Resp 16 | Wt 120.0 lb

## 2011-05-02 DIAGNOSIS — E785 Hyperlipidemia, unspecified: Secondary | ICD-10-CM

## 2011-05-02 DIAGNOSIS — K589 Irritable bowel syndrome without diarrhea: Secondary | ICD-10-CM

## 2011-05-02 DIAGNOSIS — F17200 Nicotine dependence, unspecified, uncomplicated: Secondary | ICD-10-CM

## 2011-05-02 DIAGNOSIS — R10811 Right upper quadrant abdominal tenderness: Secondary | ICD-10-CM

## 2011-05-02 DIAGNOSIS — I10 Essential (primary) hypertension: Secondary | ICD-10-CM

## 2011-05-02 LAB — CBC WITH DIFFERENTIAL/PLATELET
Basophils Relative: 0.7 % (ref 0.0–3.0)
Eosinophils Absolute: 0.2 10*3/uL (ref 0.0–0.7)
Eosinophils Relative: 2 % (ref 0.0–5.0)
HCT: 40 % (ref 36.0–46.0)
Lymphs Abs: 3.2 10*3/uL (ref 0.7–4.0)
MCHC: 33.8 g/dL (ref 30.0–36.0)
MCV: 93.2 fl (ref 78.0–100.0)
Monocytes Absolute: 0.4 10*3/uL (ref 0.1–1.0)
Neutrophils Relative %: 61.1 % (ref 43.0–77.0)
Platelets: 315 10*3/uL (ref 150.0–400.0)

## 2011-05-02 LAB — COMPREHENSIVE METABOLIC PANEL
AST: 17 U/L (ref 0–37)
Albumin: 4.6 g/dL (ref 3.5–5.2)
Alkaline Phosphatase: 92 U/L (ref 39–117)
BUN: 12 mg/dL (ref 6–23)
Glucose, Bld: 97 mg/dL (ref 70–99)
Potassium: 4.1 mEq/L (ref 3.5–5.1)
Sodium: 142 mEq/L (ref 135–145)
Total Bilirubin: 0.5 mg/dL (ref 0.3–1.2)

## 2011-05-02 LAB — URINALYSIS, ROUTINE W REFLEX MICROSCOPIC
Nitrite: NEGATIVE
Specific Gravity, Urine: <=1.005 (ref 1.000–1.030)
Total Protein, Urine: NEGATIVE
pH: 6 (ref 5.0–8.0)

## 2011-05-02 LAB — TSH: TSH: 0.79 u[IU]/mL (ref 0.35–5.50)

## 2011-05-02 LAB — LIPID PANEL
HDL: 45.9 mg/dL (ref >39.00)
Triglycerides: 256 mg/dL — ABNORMAL HIGH (ref 0.0–149.0)

## 2011-05-02 LAB — AMYLASE: Amylase: 61 U/L (ref 27–131)

## 2011-05-02 NOTE — Progress Notes (Signed)
Subjective:    Patient ID: Barbara [name], female    DOB: 11-Jan-1961, 51 y.o.   MRN: [id number]  Abdominal Pain This is a new problem. Episode onset: 2 weeks ago. The onset quality is gradual. The problem occurs intermittently. The most recent episode lasted 2 weeks. The problem has been unchanged. The pain is located in the RUQ. The pain is mild. The quality of the pain is cramping and aching. The abdominal pain does not radiate. Associated symptoms include diarrhea, flatus and nausea. Pertinent negatives include no anorexia, arthralgias, belching, constipation, dysuria, fever, frequency, headaches, hematochezia, hematuria, melena, myalgias, vomiting or weight loss. The pain is aggravated by nothing. The pain is relieved by bowel movements. She has tried oral narcotic analgesics and proton pump inhibitors for the symptoms. The treatment provided no relief.      Review of Systems  Constitutional: Negative for fever, chills, weight loss, diaphoresis, activity change, appetite change, fatigue and unexpected weight change.  HENT: Negative.   Eyes: Negative.   Respiratory: Negative.   Cardiovascular: Negative for chest pain, palpitations and leg swelling.  Gastrointestinal: Positive for nausea, abdominal pain, diarrhea and flatus. Negative for vomiting, constipation, blood in stool, melena, hematochezia, abdominal distention, anal bleeding, rectal pain and anorexia.  Genitourinary: Negative for dysuria, urgency, frequency, hematuria, flank pain, decreased urine volume, enuresis, difficulty urinating and dyspareunia.  Musculoskeletal: Negative for myalgias, back pain, joint swelling, arthralgias and gait problem.  Skin: Negative for color change, pallor, rash and wound.  Neurological: Negative for dizziness, tremors, seizures, syncope, facial asymmetry, speech difficulty, weakness, light-headedness, numbness and headaches.  Hematological: Negative for adenopathy. Does not bruise/bleed easily.    Psychiatric/Behavioral: Negative.        Objective:   Physical Exam  Vitals reviewed. Constitutional: She is oriented to person, place, and time. She appears well-developed and well-nourished. No distress.  HENT:  Head: Normocephalic and atraumatic.  Mouth/Throat: Oropharynx is clear and moist. No oropharyngeal exudate.  Eyes: Conjunctivae are normal. Right eye exhibits no discharge. Left eye exhibits no discharge. No scleral icterus.  Neck: Normal range of motion. Neck supple. No JVD present. No tracheal deviation present. No thyromegaly present.  Cardiovascular: Normal rate, regular rhythm, normal heart sounds and intact distal pulses.  Exam reveals no gallop and no friction rub.   No murmur heard. Pulmonary/Chest: Effort normal and breath sounds normal. No stridor. No respiratory distress. She has no wheezes. She has no rales. She exhibits no tenderness.  Abdominal: Soft. Bowel sounds are normal. She exhibits no distension and no mass. There is no tenderness. There is no rebound and no guarding.  Genitourinary: Rectum normal. Rectal exam shows no external hemorrhoid, no internal hemorrhoid, no fissure, no mass, no tenderness and anal tone normal. Guaiac negative stool.  Musculoskeletal: Normal range of motion. She exhibits no edema and no tenderness.  Lymphadenopathy:    She has no cervical adenopathy.  Neurological: She is oriented to person, place, and time.  Skin: Skin is warm and dry. No rash noted. She is not diaphoretic. No erythema. No pallor.  Psychiatric: She has a normal mood and affect. Her behavior is normal. Judgment and thought content normal.      Lab Results  Component Value Date   WBC 8.3 01/16/2011   HGB 12.2 01/16/2011   HCT 35.9* 01/16/2011   PLT 279.0 01/16/2011   GLUCOSE 75 01/16/2011   CHOL 249* 10/17/2009   TRIG 248.0* 10/17/2009   HDL 47.50 10/17/2009   LDLDIRECT 160.3 10/17/2009   ALT 18  01/16/2011   AST 17 01/16/2011   NA 142 01/16/2011   K 4.3  01/16/2011   CL 105 01/16/2011   CREATININE 0.7 01/16/2011   BUN 12 01/16/2011   CO2 29 01/16/2011   TSH 1.00 01/10/2010      Assessment & Plan:

## 2011-05-02 NOTE — Assessment & Plan Note (Signed)
Her BP is well controlled, I will check her lytes and renal function today 

## 2011-05-02 NOTE — Assessment & Plan Note (Signed)
FLp today 

## 2011-05-02 NOTE — Assessment & Plan Note (Signed)
She does not appear to have an acute abd process, I will check her labs to look for secondary causes, she will stay on the same meds for pain and spasm

## 2011-05-02 NOTE — Patient Instructions (Signed)

## 2011-05-02 NOTE — Assessment & Plan Note (Signed)
We talked again about smoking cessation, she will try to quit with patches

## 2011-05-02 NOTE — Assessment & Plan Note (Signed)
Continue meds. 

## 2011-05-03 ENCOUNTER — Other Ambulatory Visit: Payer: Self-pay | Admitting: Internal Medicine

## 2011-05-05 ENCOUNTER — Encounter: Payer: Self-pay | Admitting: Internal Medicine

## 2011-05-30 ENCOUNTER — Encounter: Payer: Self-pay | Admitting: Internal Medicine

## 2011-06-12 NOTE — Progress Notes (Signed)
For Dr. Yetta Barre

## 2011-06-18 ENCOUNTER — Encounter: Payer: Self-pay | Admitting: Internal Medicine

## 2011-06-18 ENCOUNTER — Ambulatory Visit (INDEPENDENT_AMBULATORY_CARE_PROVIDER_SITE_OTHER): Payer: Medicare PPO | Admitting: Internal Medicine

## 2011-06-18 VITALS — BP 140/88 | HR 72 | Temp 97.9°F | Resp 16 | Wt 117.0 lb

## 2011-06-18 DIAGNOSIS — I10 Essential (primary) hypertension: Secondary | ICD-10-CM

## 2011-06-18 DIAGNOSIS — K589 Irritable bowel syndrome without diarrhea: Secondary | ICD-10-CM

## 2011-06-18 DIAGNOSIS — F17200 Nicotine dependence, unspecified, uncomplicated: Secondary | ICD-10-CM

## 2011-06-18 DIAGNOSIS — R6881 Early satiety: Secondary | ICD-10-CM

## 2011-06-18 MED ORDER — ALIGN 4 MG PO CAPS: 1.0000 | ORAL_CAPSULE | Freq: Every day | ORAL | Status: DC

## 2011-06-18 MED ORDER — ESOMEPRAZOLE MAGNESIUM 40 MG PO CPDR: 40.0000 mg | DELAYED_RELEASE_CAPSULE | Freq: Every day | ORAL | Status: DC

## 2011-06-18 NOTE — Progress Notes (Signed)
Subjective:    Patient ID: Barbara [name], female    DOB: 29-Nov-1960, 51 y.o.   MRN: [id number]  Diarrhea  This is a recurrent problem. The current episode started in the past 7 days. The problem occurs 2 to 4 times per day. The problem has been gradually improving. The stool consistency is described as watery. Associated symptoms include abdominal pain and vomiting. Pertinent negatives include no arthralgias, bloating, chills, coughing, fever, headaches, increased  flatus, myalgias, sweats, URI or weight loss. The symptoms are aggravated by stress. Risk factors include no known risk factors. Treatments tried: robinul. The treatment provided moderate relief. Her past medical history is significant for irritable bowel syndrome.      Review of Systems  Constitutional: Positive for appetite change (early satiety). Negative for fever, chills, weight loss, diaphoresis, activity change, fatigue and unexpected weight change.  HENT: Negative.   Eyes: Negative.   Respiratory: Negative for apnea, cough, choking, shortness of breath, wheezing and stridor.   Cardiovascular: Negative for chest pain, palpitations and leg swelling.  Gastrointestinal: Positive for nausea, vomiting, abdominal pain and diarrhea. Negative for constipation, blood in stool, abdominal distention, anal bleeding, rectal pain, bloating and flatus.  Genitourinary: Negative for dysuria, urgency, frequency, hematuria, flank pain, decreased urine volume, enuresis, difficulty urinating and dyspareunia.  Musculoskeletal: Negative for myalgias, back pain, joint swelling, arthralgias and gait problem.  Skin: Negative for color change, pallor, rash and wound.  Neurological: Negative for dizziness, tremors, seizures, syncope, facial asymmetry, speech difficulty, weakness, light-headedness, numbness and headaches.  Hematological: Negative for adenopathy. Does not bruise/bleed easily.  Psychiatric/Behavioral: Negative.        Objective:   Physical Exam  Vitals reviewed. Constitutional: She is oriented to person, place, and time. She appears well-developed and well-nourished.  Non-toxic appearance. She does not have a sickly appearance. She does not appear ill. No distress.  HENT:  Head: Normocephalic and atraumatic.  Mouth/Throat: Oropharynx is clear and moist. No oropharyngeal exudate.  Eyes: Conjunctivae are normal. Right eye exhibits no discharge. Left eye exhibits no discharge. No scleral icterus.  Neck: Normal range of motion. Neck supple. No JVD present. No tracheal deviation present. No thyromegaly present.  Cardiovascular: Normal rate, regular rhythm, normal heart sounds and intact distal pulses.  Exam reveals no gallop and no friction rub.   No murmur heard. Pulmonary/Chest: Effort normal and breath sounds normal. No stridor. No respiratory distress. She has no wheezes. She has no rales. She exhibits no tenderness.  Abdominal: Soft. Bowel sounds are normal. She exhibits no shifting dullness, no distension, no pulsatile liver, no fluid wave, no abdominal bruit, no ascites, no pulsatile midline mass and no mass. There is no hepatosplenomegaly. There is no tenderness. There is no rebound, no guarding and no CVA tenderness. No hernia. Hernia confirmed negative in the ventral area.  Musculoskeletal: Normal range of motion. She exhibits no edema and no tenderness.  Lymphadenopathy:    She has no cervical adenopathy.  Neurological: She is alert and oriented to person, place, and time.  Skin: Skin is warm and dry. No rash noted. She is not diaphoretic. No erythema. No pallor.  Psychiatric: She has a normal mood and affect. Her behavior is normal. Judgment and thought content normal.      Lab Results  Component Value Date   WBC 9.9 05/02/2011   HGB 13.5 05/02/2011   HCT 40.0 05/02/2011   PLT 315.0 05/02/2011   GLUCOSE 97 05/02/2011   CHOL 246* 05/02/2011   TRIG 256.0* 05/02/2011  HDL 45.90 05/02/2011   LDLDIRECT 154.5 05/02/2011   ALT  16 05/02/2011   AST 17 05/02/2011   NA 142 05/02/2011   K 4.1 05/02/2011   CL 106 05/02/2011   CREATININE 0.8 05/02/2011   BUN 12 05/02/2011   CO2 28 05/02/2011   TSH 0.79 05/02/2011      Assessment & Plan:

## 2011-06-18 NOTE — Patient Instructions (Signed)
Irritable Bowel Syndrome Irritable Bowel Syndrome (IBS) is caused by a disturbance of normal bowel function. Other terms used are spastic colon, mucous colitis, and irritable colon. It does not require surgery, nor does it lead to cancer. There is no cure for IBS. But with proper diet, stress reduction, and medication, you will find that your problems (symptoms) will gradually disappear or improve. IBS is a common digestive disorder. It usually appears in late adolescence or early adulthood. Women develop it twice as often as men. CAUSES   After food has been digested and absorbed in the small intestine, waste material is moved into the colon (large intestine). In the colon, water and salts are absorbed from the undigested products coming from the small intestine. The remaining residue, or fecal material, is held for elimination. Under normal circumstances, gentle, rhythmic contractions on the bowel walls push the fecal material along the colon towards the rectum. In IBS, however, these contractions are irregular and poorly coordinated. The fecal material is either retained too long, resulting in constipation, or expelled too soon, producing diarrhea. SYMPTOMS   The most common symptom of IBS is pain. It is typically in the lower left side of the belly (abdomen). But it may occur anywhere in the abdomen. It can be felt as heartburn, backache, or even as a dull pain in the arms or shoulders. The pain comes from excessive bowel-muscle spasms and from the buildup of gas and fecal material in the colon. This pain:  Can range from sharp belly (abdominal) cramps to a dull, continuous ache.   Usually worsens soon after eating.   Is typically relieved by having a bowel movement or passing gas.  Abdominal pain is usually accompanied by constipation. But it may also produce diarrhea. The diarrhea typically occurs right after a meal or upon arising in the morning. The stools are typically soft and watery. They are  often flecked with secretions (mucus). Other symptoms of IBS include:  Bloating.   Loss of appetite.   Heartburn.   Feeling sick to your stomach (nausea).   Belching   Vomiting   Gas.  IBS may also cause a number of symptoms that are unrelated to the digestive system:  Fatigue.   Headaches.   Anxiety   Shortness of breath   Difficulty in concentrating.   Dizziness.  These symptoms tend to come and go. DIAGNOSIS   The symptoms of IBS closely mimic the symptoms of other, more serious digestive disorders. So your caregiver may wish to perform a variety of additional tests to exclude these disorders. He/she wants to be certain of learning what is wrong (diagnosis). The nature and purpose of each test will be explained to you. TREATMENT A number of medications are available to help correct bowel function and/or relieve bowel spasms and abdominal pain. Among the drugs available are:  Mild, non-irritating laxatives for severe constipation and to help restore normal bowel habits.   Specific anti-diarrheal medications to treat severe or prolonged diarrhea.   Anti-spasmodic agents to relieve intestinal cramps.   Your caregiver may also decide to treat you with a mild tranquilizer or sedative during unusually stressful periods in your life.  The important thing to remember is that if any drug is prescribed for you, make sure that you take it exactly as directed. Make sure that your caregiver knows how well it worked for you. HOME CARE INSTRUCTIONS    Avoid foods that are high in fat or oils. Some examples are:heavy cream, butter, frankfurters,   sausage, and other fatty meats.   Avoid foods that have a laxative effect, such as fruit, fruit juice, and dairy products.   Cut out carbonated drinks, chewing gum, and "gassy" foods, such as beans and cabbage. This may help relieve bloating and belching.   Bran taken with plenty of liquids may help relieve constipation.   Keep track  of what foods seem to trigger your symptoms.   Avoid emotionally charged situations or circumstances that produce anxiety.   Start or continue exercising.   Get plenty of rest and sleep.  MAKE SURE YOU:    Understand these instructions.   Will watch your condition.   Will get help right away if you are not doing well or get worse.  Document Released: 03/17/2005 Document Revised: 03/06/2011 Document Reviewed: 11/05/2007 [employer] Patient Information 2012 [employer]. 

## 2011-06-19 ENCOUNTER — Encounter: Payer: Self-pay | Admitting: Internal Medicine

## 2011-06-19 NOTE — Assessment & Plan Note (Signed)
Her BP is well controlled 

## 2011-06-19 NOTE — Assessment & Plan Note (Signed)
I am concerned that she may have some UGI pathology so I have asked to see GI, today I stopped her nsaids but will keep her on the PPI

## 2011-06-19 NOTE — Assessment & Plan Note (Signed)
Will add align to the robinul and see if that helps with her symptom management

## 2011-06-19 NOTE — Assessment & Plan Note (Signed)
She continues to try to quit smoking, she has decreased since her last visit

## 2011-07-11 ENCOUNTER — Encounter: Payer: Self-pay | Admitting: *Deleted

## 2011-07-15 ENCOUNTER — Other Ambulatory Visit (INDEPENDENT_AMBULATORY_CARE_PROVIDER_SITE_OTHER): Payer: Medicare PPO

## 2011-07-15 ENCOUNTER — Encounter: Payer: Self-pay | Admitting: Internal Medicine

## 2011-07-15 ENCOUNTER — Ambulatory Visit (INDEPENDENT_AMBULATORY_CARE_PROVIDER_SITE_OTHER): Payer: Medicare PPO | Admitting: Internal Medicine

## 2011-07-15 VITALS — BP 100/56 | HR 72 | Ht 59.0 in | Wt 115.6 lb

## 2011-07-15 DIAGNOSIS — R634 Abnormal weight loss: Secondary | ICD-10-CM

## 2011-07-15 DIAGNOSIS — R6881 Early satiety: Secondary | ICD-10-CM

## 2011-07-15 DIAGNOSIS — K589 Irritable bowel syndrome without diarrhea: Secondary | ICD-10-CM

## 2011-07-15 DIAGNOSIS — R109 Unspecified abdominal pain: Secondary | ICD-10-CM

## 2011-07-15 DIAGNOSIS — R1030 Lower abdominal pain, unspecified: Secondary | ICD-10-CM

## 2011-07-15 DIAGNOSIS — K529 Noninfective gastroenteritis and colitis, unspecified: Secondary | ICD-10-CM

## 2011-07-15 DIAGNOSIS — R197 Diarrhea, unspecified: Secondary | ICD-10-CM

## 2011-07-15 LAB — IGA: IgA: 107 mg/dL (ref 68–378)

## 2011-07-15 MED ORDER — PEG-KCL-NACL-NASULF-NA ASC-C 100 G PO SOLR: 1.0000 | Freq: Once | ORAL | Status: DC

## 2011-07-15 MED ORDER — DICYCLOMINE HCL 20 MG PO TABS: ORAL_TABLET | ORAL | Status: DC

## 2011-07-15 NOTE — Progress Notes (Signed)
Subjective:    Patient ID: Barbara [name], female    DOB: 08-Nov-1960, 51 y.o.   MRN: [id number]  Referred by: Etta Grandchild, MD [address] [place], [place] [address]    HPI This 51 year old white woman is known to me from prior colonoscopy for screening in 2011. Her sedation was inadequate, there were no polyps seen.  Her current problems the past 3 months for a constant lower abdominal pain and watery stools with mucus production at times. They're urgent at times but she has not had incontinence. She has lost about 12 pounds in the last year. Stool do occur at night sometimes. She has nausea and early satiety also and she says her husband tells her she has hiccups when she eats. He does have a diagnosis of IBS made years ago in [place], she was on an illumination diet that point. That did not seem to help but she does note tomatoes and onions will cause problems. Her primary care provider has prescribed Robinul which helps but his costly. She believes a problem she has now are similar to her IBS problems but definitely more severe.  She does relate increased stress with a PET dog dying recently, working on her Masters degree and her apartment complex is being taken by him in detail main. However she says she is frequently under stress and she is not convinced that related to her problems. Other symptoms include fatigue, poor concentration, night sweats and says there are different than her menopausal sweats. She has insomnia but is able to sleep using temazepam  She has not had any rectal bleeding. 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 The remainder of the GI review of systems is negative at this time.  Medications, allergies, past medical history, past surgical history, family history and social history are reviewed and updated in the EMR.    Review of Systems As mentioned in the history of present illness. She also has headaches, muscle pains, anxiety, back pain and  leaks urine at times. All other review of systems negative.    Objective:   Physical Exam General:  Well-developed, well-nourished and in no acute distress Eyes:  anicteric. ENT:   Mouth and posterior pharynx free of lesions. Positive for dentures Neck:   supple w/o thyromegaly or mass.  Lungs: Clear to auscultation bilaterally. Heart:  S1S2, no rubs, murmurs, gallops. Abdomen:  soft, mildly tender in a diffuse pattern, no hepatosplenomegaly, hernia, or mass and BS+.  Rectal: Deferred Lymph:  no cervical or supraclavicular adenopathy. Extremities:   no edema Skin   no rash. Tattoo on right shoulder Neuro:  A&O x 3.  Psych:  appropriate mood and  Affect.   Data Reviewed: Lab Results  Component Value Date   WBC 9.9 05/02/2011   HGB 13.5 05/02/2011   HCT 40.0 05/02/2011   MCV 93.2 05/02/2011   PLT 315.0 05/02/2011      Chemistry      Component Value Date/Time   NA 142 05/02/2011 1629   K 4.1 05/02/2011 1629   CL 106 05/02/2011 1629   CO2 28 05/02/2011 1629   BUN 12 05/02/2011 1629   CREATININE 0.8 05/02/2011 1629      Component Value Date/Time   CALCIUM 9.9 05/02/2011 1629   ALKPHOS 92 05/02/2011 1629   AST 17 05/02/2011 1629   ALT 16 05/02/2011 1629   BILITOT 0.5 05/02/2011 1629     Lab Results  Component Value Date   TSH 0.79  05/02/2011         Assessment & Plan:   1. Chronic diarrhea   2. Weight loss   3. Lower abdominal pain   4. Early satiety   5. IBS (irritable bowel syndrome)     Her problem certainly all could be functional in origin. However she now has a chronic diarrhea which is relatively new in the recent past, early satiety and weight loss. Organic causes of this need to be excluded. I do suspect her situational stress are playing some role.  Plan is for an EGD and colonoscopy, propofol sedation will be used given poor tolerance to moderate sedation in the past. The risks and benefits as well as alternatives of endoscopic procedure(s) have been discussed and reviewed. All  questions answered. The patient agrees to proceed.   Celiac testing will be performed as well as that remains a possibility. If serology is positive duodenal biopsy will be undertaken.  Dicyclomine prescription is given to the patient to use instead of Robinul, Robinul is on her formulary but still costly and she can get the dicyclomine for four dollars from [facility].   ZO:[id number] Yetta Barre, MD

## 2011-07-15 NOTE — Patient Instructions (Signed)
We have sent the following medications to your pharmacy for you to pick up at your convenience: Generic Bentyl  Stop your glycopyrrolate per Dr. Leone Payor..  You have been scheduled for an endoscopy and colonoscopy with propofol. Please follow the written instructions given to you at your visit today. Please pick up your prep at the pharmacy within the next 1-3 days.  Your physician has requested that you go to the basement for the following lab work before leaving today.

## 2011-07-18 NOTE — Progress Notes (Signed)
Quick Note:  Does not have celiac disese - please let her know its ok ______

## 2011-07-23 ENCOUNTER — Telehealth: Payer: Self-pay | Admitting: Internal Medicine

## 2011-07-23 MED ORDER — PROMETHAZINE HCL 25 MG/ML IJ SOLN: INTRAMUSCULAR | Status: DC

## 2011-07-23 NOTE — Telephone Encounter (Signed)
Patient advised.

## 2011-07-23 NOTE — Telephone Encounter (Signed)
1) BRAT diet today 2) promethazine 25 mg 1/2-1 every 8 hours as needed for nausea #30 no refills

## 2011-07-23 NOTE — Telephone Encounter (Signed)
Patient reports that "I am all out of sorts today".  She states she doesn't feel well, has had two episodes of diarrhea and lower abdominal pain.  She also reports she is having nausea. She is advised that she needs to continue her dicyclomine as ordered and can add imodium for the diarrhea she is having.  Dr Leone Payor she is scheduled for a colon/endo on 08/01/11, do you have any additional recommendations?

## 2011-07-31 ENCOUNTER — Telehealth: Payer: Self-pay | Admitting: Internal Medicine

## 2011-07-31 NOTE — Telephone Encounter (Signed)
Returned patient's call.  She had questions regarding taking her meds in the morning of her procedure tomorrow.  I advised her that the only meds we instruct to hold would be diabetic meds, anticoagulation meds, and sometimes Iron.  She advised that she is not taking any of these.  Advised her that it was up to her whether or not she takes them prior to or after her procedure.  If she takes before, take them with a small sip of water.  She agreed.  All questions were answered.

## 2011-08-01 ENCOUNTER — Ambulatory Visit (AMBULATORY_SURGERY_CENTER): Payer: Medicare PPO | Admitting: Internal Medicine

## 2011-08-01 ENCOUNTER — Encounter: Payer: Self-pay | Admitting: Internal Medicine

## 2011-08-01 VITALS — BP 137/83 | HR 69 | Temp 98.6°F | Resp 21 | Ht 59.0 in | Wt 115.0 lb

## 2011-08-01 DIAGNOSIS — D126 Benign neoplasm of colon, unspecified: Secondary | ICD-10-CM

## 2011-08-01 DIAGNOSIS — R197 Diarrhea, unspecified: Secondary | ICD-10-CM

## 2011-08-01 DIAGNOSIS — R634 Abnormal weight loss: Secondary | ICD-10-CM

## 2011-08-01 DIAGNOSIS — R109 Unspecified abdominal pain: Secondary | ICD-10-CM

## 2011-08-01 MED ORDER — SODIUM CHLORIDE 0.9 % IV SOLN: 500.0000 mL | INTRAVENOUS | Status: DC

## 2011-08-01 NOTE — Op Note (Signed)
[facility] [address] [place], [place]  [address]  ENDOSCOPY PROCEDURE REPORT  PATIENT:  Barbara [name], Barbara [name]  MR#:  [id number] BIRTHDATE:  April 19, 1960, 51 yrs. old  GENDER:  female  ENDOSCOPIST:  Iva Boop, MD, [facility] [address] [facility]  PROCEDURE DATE:  08/01/2011 PROCEDURE:  EGD, diagnostic [phone] ASA CLASS:  Class II INDICATIONS:  weight loss, early satiety, diarrhea  MEDICATIONS:   MAC sedation, administered by CRNA, propofol (Diprivan) 110 mg IV TOPICAL ANESTHETIC:  none  DESCRIPTION OF PROCEDURE:   After the risks benefits and alternatives of the procedure were thoroughly explained, informed consent was obtained.  The LB GIF-H180 [id number] endoscope was introduced through the mouth and advanced to the second portion of the duodenum, without limitations.  The instrument was slowly withdrawn as the mucosa was fully examined. <<PROCEDUREIMAGES>>  The upper, middle, and distal third of the esophagus were carefully inspected and no abnormalities were noted. The z-line was well seen at the GEJ. The endoscope was pushed into the fundus which was normal including a retroflexed view. The antrum,gastric body, first and second part of the duodenum were unremarkable. Retroflexed views revealed no abnormalities.    The scope was then withdrawn from the patient and the procedure completed.  COMPLICATIONS:  None  ENDOSCOPIC IMPRESSION: 1) Normal EGD RECOMMENDATIONS: colonoscopy next  Iva Boop, MD, [facility]  CC:  Etta Grandchild, MD and The Patient  n. eSIGNED:   Iva Boop at 08/01/2011 10:20 AM  Marva Panda, [phone]

## 2011-08-01 NOTE — Progress Notes (Signed)
Patient did not have preoperative order for IV antibiotic SSI prophylaxis. (G8918)  Patient did not experience any of the following events: a burn prior to discharge; a fall within the facility; wrong site/side/patient/procedure/implant event; or a hospital transfer or hospital admission upon discharge from the facility. (G8907)  

## 2011-08-01 NOTE — Patient Instructions (Signed)
YOU HAD AN ENDOSCOPIC PROCEDURE TODAY AT THE [facility]: Refer to the procedure report that was given to you for any specific questions about what was found during the examination.  If the procedure report does not answer your questions, please call your gastroenterologist to clarify.  If you requested that your care partner not be given the details of your procedure findings, then the procedure report has been included in a sealed envelope for you to review at your convenience later.  YOU SHOULD EXPECT: Some feelings of bloating in the abdomen. Passage of more gas than usual.  Walking can help get rid of the air that was put into your GI tract during the procedure and reduce the bloating. If you had a lower endoscopy (such as a colonoscopy or flexible sigmoidoscopy) you may notice spotting of blood in your stool or on the toilet paper. If you underwent a bowel prep for your procedure, then you may not have a normal bowel movement for a few days.  DIET: Your first meal following the procedure should be a light meal and then it is ok to progress to your normal diet.  A half-sandwich or bowl of soup is an example of a good first meal.  Heavy or fried foods are harder to digest and may make you feel nauseous or bloated.  Likewise meals heavy in dairy and vegetables can cause extra gas to form and this can also increase the bloating.  Drink plenty of fluids but you should avoid alcoholic beverages for 24 hours.  ACTIVITY: Your care partner should take you home directly after the procedure.  You should plan to take it easy, moving slowly for the rest of the day.  You can resume normal activity the day after the procedure however you should NOT DRIVE or use heavy machinery for 24 hours (because of the sedation medicines used during the test).    SYMPTOMS TO REPORT IMMEDIATELY: A gastroenterologist can be reached at any hour.  During normal business hours, 8:30 AM to 5:00 PM Monday through Friday,  call [phone].  After hours and on weekends, please call the GI answering service at [phone] who will take a message and have the physician on call contact you.   Following lower endoscopy (colonoscopy or flexible sigmoidoscopy):  Excessive amounts of blood in the stool  Significant tenderness or worsening of abdominal pains  Swelling of the abdomen that is new, acute  Fever of 100F or higher  Following upper endoscopy (EGD)  Vomiting of blood or coffee ground material  New chest pain or pain under the shoulder blades  Painful or persistently difficult swallowing  New shortness of breath  Fever of 100F or higher  Black, tarry-looking stools  FOLLOW UP: If any biopsies were taken you will be contacted by phone or by letter within the next 1-3 weeks.  Call your gastroenterologist if you have not heard about the biopsies in 3 weeks.  Our staff will call the home number listed on your records the next business day following your procedure to check on you and address any questions or concerns that you may have at that time regarding the information given to you following your procedure. This is a courtesy call and so if there is no answer at the home number and we have not heard from you through the emergency physician on call, we will assume that you have returned to your regular daily activities without incident.  SIGNATURES/CONFIDENTIALITY: You and/or your care   partner have signed paperwork which will be entered into your electronic medical record.  These signatures attest to the fact that that the information above on your After Visit Summary has been reviewed and is understood.  Full responsibility of the confidentiality of this discharge information lies with you and/or your care-partner.  

## 2011-08-01 NOTE — Op Note (Signed)
 [facility] [address] [place], [place]  [address]  COLONOSCOPY PROCEDURE REPORT  PATIENT:  Barbara [name], Barbara [name]  MR#:  [id number] BIRTHDATE:  [birth year], 51 yrs. old  GENDER:  female ENDOSCOPIST:  Iva Boop, MD, [facility]  PROCEDURE DATE:  08/01/2011 PROCEDURE:  Colonoscopy with biopsy ASA CLASS:  Class II INDICATIONS:  unexplained diarrhea, weight loss MEDICATIONS:   There was residual sedation effect present from prior procedure., MAC sedation, administered by CRNA, propofol (Diprivan) 160 mg IV  DESCRIPTION OF PROCEDURE:   After the risks benefits and alternatives of the procedure were thoroughly explained, informed consent was obtained.  Digital rectal exam was performed and revealed no abnormalities.   The LB CF-H180AL [id number] endoscope was introduced through the anus and advanced to the terminal ileum which was intubated for a short distance, without limitations. The quality of the prep was excellent, using MoviPrep.  The instrument was then slowly withdrawn as the colon was fully examined. <<PROCEDUREIMAGES>> FINDINGS:  A normal appearing cecum, ileocecal valve, and appendiceal orifice were identified. The ascending, hepatic flexure, transverse, splenic flexure, descending, sigmoid colon, and rectum appeared unremarkable. Random biopsies were obtained and sen t to pathology.  The terminal ileum appeared normal also. Retroflexed views in the rectum revealed no abnormalities.    The time to cecum = 2:22 minutes. The scope was then withdrawn in 8:21 minutes from the cecum and the procedure completed. COMPLICATIONS:  None ENDOSCOPIC IMPRESSION: 1) Normal colon - random biopsies taken 2) Normal terminal ileum RECOMMENDATIONS: 1) Await biopsy results 2) will call with results and plans REPEAT EXAM:  In 10 year(s) for routine screening colonoscopy.  Iva Boop, MD, [facility]  CC:  Etta Grandchild, MD and The Patient  n. eSIGNED:   Iva Boop at 08/01/2011  10:40 AM  Marva Panda, [phone]

## 2011-08-04 ENCOUNTER — Telehealth: Payer: Self-pay

## 2011-08-04 NOTE — Telephone Encounter (Signed)
  Follow up Call-  Call back number 08/01/2011  Post procedure Call Back phone  # [phone]  Permission to leave phone message No  comments no answering machine     Patient questions:  Do you have a fever, pain , or abdominal swelling? no Pain Score  0 *  Have you tolerated food without any problems? yes  Have you been able to return to your normal activities? yes  Do you have any questions about your discharge instructions: Diet   no Medications  no Follow up visit  no  Do you have questions or concerns about your Care? no  Actions: * If pain score is 4 or above: No action needed, pain <4.  Per the pt she is eating now.  She did say she has a rt lower quad pain and lt upper quad pain that comes and goes.  The pt could not rate it on the pain scale, but described it as "the feeling when you get your ears pierced and then goes away".  I asked if she needed me to talk with Dr. Leone Payor, She said "no, I it is not more than she was having before the procedure".  I asked her to call back if she is not better or sx worsen she said she would. Maw

## 2011-08-05 NOTE — Progress Notes (Signed)
Quick Note:  Office  Let her know that colon biopsies are normal. There is no colitis. I think her problems are IBS. Dicyclomine was helping and she should continue that - find out how her symptoms are (pain, diarrhea) now.  [facility] no letter - needs 10 year recall as stated ______

## 2011-08-06 ENCOUNTER — Telehealth: Payer: Self-pay | Admitting: Internal Medicine

## 2011-08-07 ENCOUNTER — Other Ambulatory Visit: Payer: Self-pay

## 2011-08-07 MED ORDER — PROMETHAZINE HCL 25 MG PO TABS: 25.0000 mg | ORAL_TABLET | Freq: Four times a day (QID) | ORAL | Status: AC | PRN

## 2011-08-07 NOTE — Progress Notes (Signed)
Quick Note:  Ok to represcribe phenergan as previously written  Set up rev 4-6 weeks  ______

## 2011-08-08 NOTE — Telephone Encounter (Signed)
Medication sent to [facility] for pt

## 2011-09-01 ENCOUNTER — Encounter: Payer: Self-pay | Admitting: *Deleted

## 2011-09-05 ENCOUNTER — Ambulatory Visit: Payer: Medicare PPO | Admitting: Internal Medicine

## 2011-09-07 ENCOUNTER — Other Ambulatory Visit: Payer: Self-pay | Admitting: Internal Medicine

## 2011-09-23 ENCOUNTER — Ambulatory Visit (INDEPENDENT_AMBULATORY_CARE_PROVIDER_SITE_OTHER): Payer: Medicare PPO | Admitting: Internal Medicine

## 2011-09-23 ENCOUNTER — Encounter: Payer: Self-pay | Admitting: Internal Medicine

## 2011-09-23 ENCOUNTER — Other Ambulatory Visit (INDEPENDENT_AMBULATORY_CARE_PROVIDER_SITE_OTHER): Payer: Medicare PPO

## 2011-09-23 VITALS — BP 138/84 | HR 66 | Temp 98.1°F | Resp 16 | Wt 112.0 lb

## 2011-09-23 DIAGNOSIS — M5137 Other intervertebral disc degeneration, lumbosacral region: Secondary | ICD-10-CM

## 2011-09-23 DIAGNOSIS — F341 Dysthymic disorder: Secondary | ICD-10-CM

## 2011-09-23 DIAGNOSIS — I10 Essential (primary) hypertension: Secondary | ICD-10-CM

## 2011-09-23 DIAGNOSIS — E785 Hyperlipidemia, unspecified: Secondary | ICD-10-CM

## 2011-09-23 DIAGNOSIS — M549 Dorsalgia, unspecified: Secondary | ICD-10-CM

## 2011-09-23 LAB — COMPREHENSIVE METABOLIC PANEL
ALT: 14 U/L (ref 0–35)
Albumin: 4.7 g/dL (ref 3.5–5.2)
CO2: 27 mEq/L (ref 19–32)
Calcium: 10 mg/dL (ref 8.4–10.5)
Chloride: 103 mEq/L (ref 96–112)
GFR: 94.86 mL/min (ref >60.00)
Potassium: 4.8 mEq/L (ref 3.5–5.1)
Sodium: 140 mEq/L (ref 135–145)
Total Bilirubin: 0.5 mg/dL (ref 0.3–1.2)
Total Protein: 7.8 g/dL (ref 6.0–8.3)

## 2011-09-23 LAB — LIPID PANEL: Total CHOL/HDL Ratio: 3

## 2011-09-23 MED ORDER — ALPRAZOLAM 0.5 MG PO TABS: 0.5000 mg | ORAL_TABLET | Freq: Three times a day (TID) | ORAL | Status: DC | PRN

## 2011-09-23 MED ORDER — HYDROCODONE-ACETAMINOPHEN 7.5-750 MG PO TABS: 1.0000 | ORAL_TABLET | Freq: Four times a day (QID) | ORAL | Status: DC | PRN

## 2011-09-23 NOTE — Assessment & Plan Note (Signed)
She will continue the current meds 

## 2011-09-23 NOTE — Assessment & Plan Note (Signed)
She is doing well on niacin, I will recheck her FLP and her CMP today

## 2011-09-23 NOTE — Assessment & Plan Note (Signed)
Her BP is well controlled, I will check her lytes and renal function 

## 2011-09-23 NOTE — Patient Instructions (Signed)
Hypertriglyceridemia  Diet for High blood levels of Triglycerides Most fats in food are triglycerides. Triglycerides in your blood are stored as fat in your body. High levels of triglycerides in your blood may put you at a greater risk for heart disease and stroke.  Normal triglyceride levels are less than 150 mg/dL. Borderline high levels are 150-199 mg/dl. High levels are 200 - 499 mg/dL, and very high triglyceride levels are greater than 500 mg/dL. The decision to treat high triglycerides is generally based on the level. For people with borderline or high triglyceride levels, treatment includes weight loss and exercise. Drugs are recommended for people with very high triglyceride levels. Many people who need treatment for high triglyceride levels have metabolic syndrome. This syndrome is a collection of disorders that often include: insulin resistance, high blood pressure, blood clotting problems, high cholesterol and triglycerides. TESTING PROCEDURE FOR TRIGLYCERIDES  You should not eat 4 hours before getting your triglycerides measured. The normal range of triglycerides is between 10 and 250 milligrams per deciliter (mg/dl). Some people may have extreme levels (1000 or above), but your triglyceride level may be too high if it is above 150 mg/dl, depending on what other risk factors you have for heart disease.   People with high blood triglycerides may also have high blood cholesterol levels. If you have high blood cholesterol as well as high blood triglycerides, your risk for heart disease is probably greater than if you only had high triglycerides. High blood cholesterol is one of the main risk factors for heart disease.  CHANGING YOUR DIET  Your weight can affect your blood triglyceride level. If you are more than 20% above your ideal body weight, you may be able to lower your blood triglycerides by losing weight. Eating less and exercising regularly is the best way to combat this. Fat provides  more calories than any other food. The best way to lose weight is to eat less fat. Only 30% of your total calories should come from fat. Less than 7% of your diet should come from saturated fat. A diet low in fat and saturated fat is the same as a diet to decrease blood cholesterol. By eating a diet lower in fat, you may lose weight, lower your blood cholesterol, and lower your blood triglyceride level.  Eating a diet low in fat, especially saturated fat, may also help you lower your blood triglyceride level. Ask your dietitian to help you figure how much fat you can eat based on the number of calories your caregiver has prescribed for you.  Exercise, in addition to helping with weight loss may also help lower triglyceride levels.   Alcohol can increase blood triglycerides. You may need to stop drinking alcoholic beverages.   Too much carbohydrate in your diet may also increase your blood triglycerides. Some complex carbohydrates are necessary in your diet. These may include bread, rice, potatoes, other starchy vegetables and cereals.   Reduce "simple" carbohydrates. These may include pure sugars, candy, honey, and jelly without losing other nutrients. If you have the kind of high blood triglycerides that is affected by the amount of carbohydrates in your diet, you will need to eat less sugar and less high-sugar foods. Your caregiver can help you with this.   Adding 2-4 grams of fish oil (EPA+ DHA) may also help lower triglycerides. Speak with your caregiver before adding any supplements to your regimen.  Following the Diet  Maintain your ideal weight. Your caregivers can help you with a diet. Generally,   eating less food and getting more exercise will help you lose weight. Joining a weight control group may also help. Ask your caregivers for a good weight control group in your area.  Eat low-fat foods instead of high-fat foods. This can help you lose weight too.  These foods are lower in fat. Eat MORE  of these:   Dried beans, peas, and lentils.   Egg whites.   Low-fat cottage cheese.   Fish.   Lean cuts of meat, such as round, sirloin, rump, and flank (cut extra fat off meat you fix).   Whole grain breads, cereals and pasta.   Skim and nonfat dry milk.   Low-fat yogurt.   Poultry without the skin.   Cheese made with skim or part-skim milk, such as mozzarella, parmesan, farmers', ricotta, or pot cheese.  These are higher fat foods. Eat LESS of these:   Whole milk and foods made from whole milk, such as American, blue, cheddar, monterey jack, and swiss cheese   High-fat meats, such as luncheon meats, sausages, knockwurst, bratwurst, hot dogs, ribs, corned beef, ground pork, and regular ground beef.   Fried foods.  Limit saturated fats in your diet. Substituting unsaturated fat for saturated fat may decrease your blood triglyceride level. You will need to read package labels to know which products contain saturated fats.  These foods are high in saturated fat. Eat LESS of these:   Fried pork skins.   Whole milk.   Skin and fat from poultry.   Palm oil.   Butter.   Shortening.   Cream cheese.   Bacon.   Margarines and baked goods made from listed oils.   Vegetable shortenings.   Chitterlings.   Fat from meats.   Coconut oil.   Palm kernel oil.   Lard.   Cream.   Sour cream.   Fatback.   Coffee whiteners and non-dairy creamers made with these oils.   Cheese made from whole milk.  Use unsaturated fats (both polyunsaturated and monounsaturated) moderately. Remember, even though unsaturated fats are better than saturated fats; you still want a diet low in total fat.  These foods are high in unsaturated fat:   Canola oil.   Sunflower oil.   Mayonnaise.   Almonds.   Peanuts.   Pine nuts.   Margarines made with these oils.   Safflower oil.   Olive oil.   Avocados.   Cashews.   Peanut butter.   Sunflower seeds.   Soybean oil.     Peanut oil.   Olives.   Pecans.   Walnuts.   Pumpkin seeds.  Avoid sugar and other high-sugar foods. This will decrease carbohydrates without decreasing other nutrients. Sugar in your food goes rapidly to your blood. When there is excess sugar in your blood, your liver may use it to make more triglycerides. Sugar also contains calories without other important nutrients.  Eat LESS of these:   Sugar, brown sugar, powdered sugar, jam, jelly, preserves, honey, syrup, molasses, pies, candy, cakes, cookies, frosting, pastries, colas, soft drinks, punches, fruit drinks, and regular gelatin.   Avoid alcohol. Alcohol, even more than sugar, may increase blood triglycerides. In addition, alcohol is high in calories and low in nutrients. Ask for sparkling water, or a diet soft drink instead of an alcoholic beverage.  Suggestions for planning and preparing meals   Bake, broil, grill or roast meats instead of frying.   Remove fat from meats and skin from poultry before cooking.   Add spices,   herbs, lemon juice or vinegar to vegetables instead of salt, rich sauces or gravies.   Use a non-stick skillet without fat or use no-stick sprays.   Cool and refrigerate stews and broth. Then remove the hardened fat floating on the surface before serving.   Refrigerate meat drippings and skim off fat to make low-fat gravies.   Serve more fish.   Use less butter, margarine and other high-fat spreads on bread or vegetables.   Use skim or reconstituted non-fat dry milk for cooking.   Cook with low-fat cheeses.   Substitute low-fat yogurt or cottage cheese for all or part of the sour cream in recipes for sauces, dips or congealed salads.   Use half yogurt/half mayonnaise in salad recipes.   Substitute evaporated skim milk for cream. Evaporated skim milk or reconstituted non-fat dry milk can be whipped and substituted for whipped cream in certain recipes.   Choose fresh fruits for dessert instead of  high-fat foods such as pies or cakes. Fruits are naturally low in fat.  When Dining Out   Order low-fat appetizers such as fruit or vegetable juice, pasta with vegetables or tomato sauce.   Select clear, rather than cream soups.   Ask that dressings and gravies be served on the side. Then use less of them.   Order foods that are baked, broiled, poached, steamed, stir-fried, or roasted.   Ask for margarine instead of butter, and use only a small amount.   Drink sparkling water, unsweetened tea or coffee, or diet soft drinks instead of alcohol or other sweet beverages.  QUESTIONS AND ANSWERS ABOUT OTHER FATS IN THE BLOOD: SATURATED FAT, TRANS FAT, AND CHOLESTEROL What is trans fat? Trans fat is a type of fat that is formed when vegetable oil is hardened through a process called hydrogenation. This process helps makes foods more solid, gives them shape, and prolongs their shelf life. Trans fats are also called hydrogenated or partially hydrogenated oils.  What do saturated fat, trans fat, and cholesterol in foods have to do with heart disease? Saturated fat, trans fat, and cholesterol in the diet all raise the level of LDL "bad" cholesterol in the blood. The higher the LDL cholesterol, the greater the risk for coronary heart disease (CHD). Saturated fat and trans fat raise LDL similarly.  What foods contain saturated fat, trans fat, and cholesterol? High amounts of saturated fat are found in animal products, such as fatty cuts of meat, chicken skin, and full-fat dairy products like butter, whole milk, cream, and cheese, and in tropical vegetable oils such as palm, palm kernel, and coconut oil. Trans fat is found in some of the same foods as saturated fat, such as vegetable shortening, some margarines (especially hard or stick margarine), crackers, cookies, baked goods, fried foods, salad dressings, and other processed foods made with partially hydrogenated vegetable oils. Small amounts of trans fat  also occur naturally in some animal products, such as milk products, beef, and lamb. Foods high in cholesterol include liver, other organ meats, egg yolks, shrimp, and full-fat dairy products. How can I use the new food label to make heart-healthy food choices? Check the Nutrition Facts panel of the food label. Choose foods lower in saturated fat, trans fat, and cholesterol. For saturated fat and cholesterol, you can also use the Percent Daily Value (%DV): 5% DV or less is low, and 20% DV or more is high. (There is no %DV for trans fat.) Use the Nutrition Facts panel to choose foods low in   saturated fat and cholesterol, and if the trans fat is not listed, read the ingredients and limit products that list shortening or hydrogenated or partially hydrogenated vegetable oil, which tend to be high in trans fat. POINTS TO REMEMBER: YOU NEED A LITTLE TLC (THERAPEUTIC LIFESTYLE CHANGES)  Discuss your risk for heart disease with your caregivers, and take steps to reduce risk factors.   Change your diet. Choose foods that are low in saturated fat, trans fat, and cholesterol.   Add exercise to your daily routine if it is not already being done. Participate in physical activity of moderate intensity, like brisk walking, for at least 30 minutes on most, and preferably all days of the week. No time? Break the 30 minutes into three, 10-minute segments during the day.   Stop smoking. If you do smoke, contact your caregiver to discuss ways in which they can help you quit.   Do not use street drugs.   Maintain a normal weight.   Maintain a healthy blood pressure.   Keep up with your blood work for checking the fats in your blood as directed by your caregiver.  Document Released: 01/03/2004 Document Revised: 03/06/2011 Document Reviewed: 07/31/2008 [facility] Patient Information 2012 [place], [place].Back Pain, Adult Low back pain is very common. About 1 in 5 people have back pain.The cause of low back pain is  rarely dangerous. The pain often gets better over time.About half of people with a sudden onset of back pain feel better in just 2 weeks. About 8 in 10 people feel better by 6 weeks.  CAUSES Some common causes of back pain include:  Strain of the muscles or ligaments supporting the spine.   Wear and tear (degeneration) of the spinal discs.   Arthritis.   Direct injury to the back.  DIAGNOSIS Most of the time, the direct cause of low back pain is not known.However, back pain can be treated effectively even when the exact cause of the pain is unknown.Answering your caregiver's questions about your overall health and symptoms is one of the most accurate ways to make sure the cause of your pain is not dangerous. If your caregiver needs more information, he or she may order lab work or imaging tests (X-rays or MRIs).However, even if imaging tests show changes in your back, this usually does not require surgery. HOME CARE INSTRUCTIONS For many people, back pain returns.Since low back pain is rarely dangerous, it is often a condition that people can learn to [facility] their own.   Remain active. It is stressful on the back to sit or stand in one place. Do not sit, drive, or stand in one place for more than 30 minutes at a time. Take short walks on level surfaces as soon as pain allows.Try to increase the length of time you walk each day.   Do not stay in bed.Resting more than 1 or 2 days can delay your recovery.   Do not avoid exercise or work.Your body is made to move.It is not dangerous to be active, even though your back may hurt.Your back will likely heal faster if you return to being active before your pain is gone.   Pay attention to your body when you bend and lift. Many people have less discomfortwhen lifting if they bend their knees, keep the load close to their bodies,and avoid twisting. Often, the most comfortable positions are those that put less stress on your recovering  back.   Find a comfortable position to sleep. Use a firm  mattress and lie on your side with your knees slightly bent. If you lie on your back, put a pillow under your knees.   Only take over-the-counter or prescription medicines as directed by your caregiver. Over-the-counter medicines to reduce pain and inflammation are often the most helpful.Your caregiver may prescribe muscle relaxant drugs.These medicines help dull your pain so you can more quickly return to your normal activities and healthy exercise.   Put ice on the injured area.   Put ice in a plastic bag.   Place a towel between your skin and the bag.   Leave the ice on for 15 to 20 minutes, 3 to 4 times a day for the first 2 to 3 days. After that, ice and heat may be alternated to reduce pain and spasms.   Ask your caregiver about trying back exercises and gentle massage. This may be of some benefit.   Avoid feeling anxious or stressed.Stress increases muscle tension and can worsen back pain.It is important to recognize when you are anxious or stressed and learn ways to manage it.Exercise is a great option.  SEEK MEDICAL CARE IF:  You have pain that is not relieved with rest or medicine.   You have pain that does not improve in 1 week.   You have new symptoms.   You are generally not feeling well.  SEEK IMMEDIATE MEDICAL CARE IF:   You have pain that radiates from your back into your legs.   You develop new bowel or bladder control problems.   You have unusual weakness or numbness in your arms or legs.   You develop nausea or vomiting.   You develop abdominal pain.   You feel faint.  Document Released: 03/17/2005 Document Revised: 03/06/2011 Document Reviewed: 08/05/2010 [facility] Patient Information 2012 [place], [place].

## 2011-09-23 NOTE — Progress Notes (Signed)
Subjective:    Patient ID: Barbara [name], female    DOB: 08/15/1960, 51 y.o.   MRN: [id number]  Back Pain This is a chronic problem. The current episode started more than 1 year ago. The problem occurs intermittently. The problem is unchanged. The pain is present in the lumbar spine. The quality of the pain is described as aching. The pain does not radiate. The pain is at a severity of 6/10. The pain is moderate. The pain is worse during the day. The symptoms are aggravated by bending. Stiffness is present in the morning. Pertinent negatives include no abdominal pain, bladder incontinence, bowel incontinence, chest pain, dysuria, fever, headaches, leg pain, numbness, paresis, paresthesias, pelvic pain, perianal numbness, tingling, weakness or weight loss. She has tried analgesics for the symptoms. The treatment provided significant relief.      Review of Systems  Constitutional: Negative for fever, chills, weight loss, diaphoresis, activity change, appetite change, fatigue and unexpected weight change.  HENT: Negative.   Eyes: Negative.   Respiratory: Negative for cough, chest tightness, shortness of breath, wheezing and stridor.   Cardiovascular: Negative for chest pain, palpitations and leg swelling.  Gastrointestinal: Negative for nausea, vomiting, abdominal pain, diarrhea, constipation, blood in stool and bowel incontinence.  Genitourinary: Negative.  Negative for bladder incontinence, dysuria and pelvic pain.  Musculoskeletal: Positive for back pain. Negative for myalgias, joint swelling, arthralgias and gait problem.  Skin: Negative for color change, pallor, rash and wound.  Neurological: Negative.  Negative for tingling, weakness, numbness, headaches and paresthesias.  Hematological: Negative for adenopathy. Does not bruise/bleed easily.  Psychiatric/Behavioral: Positive for disturbed wake/sleep cycle. Negative for suicidal ideas, hallucinations, behavioral problems, confusion,  self-injury, dysphoric mood, decreased concentration and agitation. The patient is nervous/anxious. The patient is not hyperactive.        Objective:   Physical Exam  Vitals reviewed. Constitutional: She is oriented to person, place, and time. She appears well-developed and well-nourished. No distress.  HENT:  Head: Normocephalic and atraumatic.  Mouth/Throat: Oropharynx is clear and moist. No oropharyngeal exudate.  Eyes: Conjunctivae are normal. Right eye exhibits no discharge. Left eye exhibits no discharge. No scleral icterus.  Neck: Normal range of motion. Neck supple. No JVD present. No tracheal deviation present. No thyromegaly present.  Cardiovascular: Normal rate, regular rhythm, normal heart sounds and intact distal pulses.  Exam reveals no gallop and no friction rub.   No murmur heard. Pulmonary/Chest: Effort normal and breath sounds normal. No stridor. No respiratory distress. She has no wheezes. She has no rales. She exhibits no tenderness.  Abdominal: Soft. Bowel sounds are normal. She exhibits no distension and no mass. There is no tenderness. There is no rebound and no guarding.  Musculoskeletal: Normal range of motion. She exhibits no edema and no tenderness.       Lumbar back: Normal. She exhibits normal range of motion, no tenderness, no bony tenderness, no swelling, no edema, no deformity, no laceration, no pain, no spasm and normal pulse.  Lymphadenopathy:    She has no cervical adenopathy.  Neurological: She is alert and oriented to person, place, and time. She has normal strength. She displays no atrophy, no tremor and normal reflexes. No cranial nerve deficit or sensory deficit. She exhibits normal muscle tone. She displays a negative Romberg sign. She displays no seizure activity. Coordination and gait normal. She displays no Babinski's sign on the right side. She displays no Babinski's sign on the left side.  Reflex Scores:      Tricep  reflexes are 1+ on the right  side and 1+ on the left side.      Bicep reflexes are 1+ on the right side and 1+ on the left side.      Brachioradialis reflexes are 1+ on the right side and 1+ on the left side.      Patellar reflexes are 1+ on the right side and 1+ on the left side.      Achilles reflexes are 1+ on the right side and 1+ on the left side.      - SLR in BLE  Skin: Skin is warm and dry. No rash noted. She is not diaphoretic. No erythema. No pallor.  Psychiatric: She has a normal mood and affect. Her behavior is normal. Judgment and thought content normal.      Lab Results  Component Value Date   WBC 9.9 05/02/2011   HGB 13.5 05/02/2011   HCT 40.0 05/02/2011   PLT 315.0 05/02/2011   GLUCOSE 97 05/02/2011   CHOL 246* 05/02/2011   TRIG 256.0* 05/02/2011   HDL 45.90 05/02/2011   LDLDIRECT 154.5 05/02/2011   ALT 16 05/02/2011   AST 17 05/02/2011   NA 142 05/02/2011   K 4.1 05/02/2011   CL 106 05/02/2011   CREATININE 0.8 05/02/2011   BUN 12 05/02/2011   CO2 28 05/02/2011   TSH 0.79 05/02/2011      Assessment & Plan:

## 2011-09-23 NOTE — Assessment & Plan Note (Signed)
She gets good relief with her current meds so she will continue them, she has no danger signs today wrt her back

## 2011-09-24 ENCOUNTER — Telehealth: Payer: Self-pay

## 2011-09-24 NOTE — Telephone Encounter (Signed)
Pt called requesting refill of Temazepam. She is also requesting Rx for Lyrica to help with pain sxs. Pt says that she was evaluated by Neurology per [facility] a few years ago and told that she may have fibromyalgia, please advise.

## 2011-09-25 MED ORDER — TEMAZEPAM 30 MG PO CAPS: ORAL_CAPSULE | ORAL | Status: DC

## 2011-09-25 NOTE — Telephone Encounter (Signed)
Called x 2// no answer or VM.  Only Temazepam called in to pharmacy, closing phone note until calls back

## 2011-09-25 NOTE — Telephone Encounter (Signed)
Temazepam is ok but I want to hold off on lyrica

## 2011-09-25 NOTE — Telephone Encounter (Signed)
Returned call to patient// phone rang x 10, no personal VM

## 2011-10-20 ENCOUNTER — Ambulatory Visit (INDEPENDENT_AMBULATORY_CARE_PROVIDER_SITE_OTHER): Payer: Medicare PPO | Admitting: Internal Medicine

## 2011-10-20 ENCOUNTER — Encounter: Payer: Self-pay | Admitting: Internal Medicine

## 2011-10-20 VITALS — BP 116/68 | HR 102 | Temp 97.7°F | Resp 16 | Wt 114.8 lb

## 2011-10-20 DIAGNOSIS — I10 Essential (primary) hypertension: Secondary | ICD-10-CM

## 2011-10-20 DIAGNOSIS — M549 Dorsalgia, unspecified: Secondary | ICD-10-CM

## 2011-10-20 DIAGNOSIS — E785 Hyperlipidemia, unspecified: Secondary | ICD-10-CM

## 2011-10-20 NOTE — Progress Notes (Signed)
Subjective:    Patient ID: Barbara [name], female    DOB: [birth year], 51 y.o.   MRN: [id number]  Back Pain This is a recurrent problem. The current episode started more than 1 year ago. The problem occurs intermittently. The problem has been gradually improving since onset. The pain is present in the lumbar spine. The quality of the pain is described as aching. The pain does not radiate. The pain is at a severity of 5/10. The pain is moderate. The pain is worse during the day. The symptoms are aggravated by bending and standing. Stiffness is present in the morning. Pertinent negatives include no abdominal pain, bladder incontinence, bowel incontinence, chest pain, dysuria, fever, headaches, leg pain, numbness, paresis, paresthesias, pelvic pain, perianal numbness, tingling, weakness or weight loss. She has tried analgesics for the symptoms. The treatment provided significant relief.      Review of Systems  Constitutional: Negative.  Negative for fever and weight loss.  HENT: Negative.   Eyes: Negative.   Respiratory: Negative.   Cardiovascular: Negative.  Negative for chest pain.  Gastrointestinal: Negative.  Negative for abdominal pain and bowel incontinence.  Genitourinary: Negative.  Negative for bladder incontinence, dysuria and pelvic pain.  Musculoskeletal: Positive for back pain.  Skin: Negative.   Neurological: Negative.  Negative for tingling, weakness, numbness, headaches and paresthesias.  Hematological: Negative.   Psychiatric/Behavioral: Negative.        Objective:   Physical Exam  Vitals reviewed. Constitutional: She is oriented to person, place, and time. She appears well-developed and well-nourished. No distress.  HENT:  Head: Normocephalic and atraumatic.  Mouth/Throat: Oropharynx is clear and moist. No oropharyngeal exudate.  Eyes: Conjunctivae are normal. Right eye exhibits no discharge. Left eye exhibits no discharge. No scleral icterus.  Neck: Normal range of  motion. Neck supple. No JVD present. No tracheal deviation present. No thyromegaly present.  Cardiovascular: Normal rate, regular rhythm, normal heart sounds and intact distal pulses.  Exam reveals no gallop and no friction rub.   No murmur heard. Pulmonary/Chest: Effort normal and breath sounds normal. No stridor. No respiratory distress. She has no wheezes. She has no rales. She exhibits no tenderness.  Abdominal: Soft. Bowel sounds are normal. She exhibits no distension and no mass. There is no tenderness. There is no rebound and no guarding.  Musculoskeletal: Normal range of motion. She exhibits no edema and no tenderness.       Lumbar back: Normal. She exhibits normal range of motion, no tenderness, no bony tenderness, no swelling, no edema, no deformity, no laceration, no pain, no spasm and normal pulse.  Lymphadenopathy:    She has no cervical adenopathy.  Neurological: She is alert and oriented to person, place, and time. She has normal reflexes. She displays normal reflexes. No cranial nerve deficit. She exhibits normal muscle tone. Coordination normal.  Skin: Skin is warm and dry. No rash noted. She is not diaphoretic. No erythema. No pallor.  Psychiatric: She has a normal mood and affect. Her behavior is normal. Judgment and thought content normal.      Lab Results  Component Value Date   WBC 9.9 05/02/2011   HGB 13.5 05/02/2011   HCT 40.0 05/02/2011   PLT 315.0 05/02/2011   GLUCOSE 96 09/23/2011   CHOL 191 09/23/2011   TRIG 127.0 09/23/2011   HDL 55.40 09/23/2011   LDLDIRECT 154.5 05/02/2011   LDLCALC 110* 09/23/2011   ALT 14 09/23/2011   AST 18 09/23/2011   NA 140 09/23/2011   K  4.8 09/23/2011   CL 103 09/23/2011   CREATININE 0.7 09/23/2011   BUN 15 09/23/2011   CO2 27 09/23/2011   TSH 0.79 05/02/2011      Assessment & Plan:

## 2011-10-21 ENCOUNTER — Encounter: Payer: Self-pay | Admitting: Internal Medicine

## 2011-10-21 NOTE — Assessment & Plan Note (Signed)
She does not want to take a statin at this time as she does not think that she has risk factors for CAD or CVA that require a medication

## 2011-10-21 NOTE — Assessment & Plan Note (Signed)
Her BP is well controlled 

## 2011-10-21 NOTE — Assessment & Plan Note (Signed)
Stable, no changes  

## 2011-11-10 ENCOUNTER — Other Ambulatory Visit: Payer: Self-pay | Admitting: Internal Medicine

## 2011-11-26 ENCOUNTER — Telehealth: Payer: Self-pay | Admitting: Internal Medicine

## 2011-11-26 NOTE — Telephone Encounter (Signed)
Caller: [name]/Patient; Patient Name: Barbara [name]; PCP: Sanda Linger (Adults only); Best Callback Phone Number: [phone].  Called to request antibiotic for infected lower 2nd  tooth with pain when biting, funny taste in mouth, bleeding gums for 1 week when brushing teeth.  Onset: 11/24/11.  Afebrile. Hysterectomy.  Has appointment to see dentist 12/15/11 for tooth extraction.  Cannot afford to see dentist twice just for antibiotics. Informed of office policy not to call out antibiotics without appointment  Advised to see MD within 24 hours for unexplained bleeding from gums not previously evaluated per Jaw Symptoms guideline. Declined appointments offered for 11/26/11 and 11/27/11 since they are not with Dr Yetta Barre.  States will call front office for appt.  Transferred to scheduling queue.

## 2011-11-27 ENCOUNTER — Ambulatory Visit (INDEPENDENT_AMBULATORY_CARE_PROVIDER_SITE_OTHER): Payer: Medicare PPO | Admitting: Internal Medicine

## 2011-11-27 ENCOUNTER — Encounter: Payer: Self-pay | Admitting: Internal Medicine

## 2011-11-27 VITALS — BP 120/86 | HR 68 | Temp 98.2°F | Ht 59.0 in | Wt 116.1 lb

## 2011-11-27 DIAGNOSIS — M79609 Pain in unspecified limb: Secondary | ICD-10-CM

## 2011-11-27 DIAGNOSIS — G4710 Hypersomnia, unspecified: Secondary | ICD-10-CM

## 2011-11-27 DIAGNOSIS — K05219 Aggressive periodontitis, localized, unspecified severity: Secondary | ICD-10-CM | POA: Insufficient documentation

## 2011-11-27 DIAGNOSIS — M79604 Pain in right leg: Secondary | ICD-10-CM

## 2011-11-27 MED ORDER — CLINDAMYCIN HCL 300 MG PO CAPS: 300.0000 mg | ORAL_CAPSULE | Freq: Three times a day (TID) | ORAL | Status: AC

## 2011-11-27 NOTE — Assessment & Plan Note (Addendum)
Very small area but significant, nondraining, allerigic to PCN, for short course cleocin, f/u with tooth appt sept 3 as she has planned

## 2011-11-27 NOTE — Assessment & Plan Note (Signed)
Quit tobacco may 2013 after long hx;  No hx of PVD and does have hx of chronic LBP (MRI normal may 2012) and elev cholesterol, as well as FMS, but ? Of claudication - for LE arterial dopplers

## 2011-11-27 NOTE — Assessment & Plan Note (Signed)
Snores at night, non restorative sleep, daytime somnolence, worse since her upper teeth were pulled, for pulm referral - ? Upper airway resistance issue/osa?

## 2011-11-27 NOTE — Patient Instructions (Addendum)
Take all new medications as prescribed - the antibiotic You will be contacted regarding the referral for: Leg artery test for circulation, and pulmonary for ? Sleep apnea Continue all other medications as before Please continue to not smoke as you are doing Please keep your appointments with your specialists as you have planned - the tooth pull for sept 3 Please continue your efforts at being more active, low cholesterol diet, and weight control.

## 2011-11-29 ENCOUNTER — Encounter: Payer: Self-pay | Admitting: Internal Medicine

## 2011-11-29 NOTE — Progress Notes (Signed)
Subjective:    Patient ID: Barbara [name], female    DOB: Sep 12, 1960, 51 y.o.   MRN: [id number]  HPI  Here for acute visit with 2-3 days onset worsening post right lower molar gum swelling/tender without drainage, fever or jaw pain/swelling, fever, chills.  Unable to see dental urgently, but does have appt for right post molar to be pulled sept 3, had to put off until then due to cost and saving up money.  Also incidentally mentions - Does have sense of ongoing fatigue, with significant daytime hypersomnolence, snores at night.  Can fall asleep anytime.  Also mentions worsening onset bilat LE pain below the knees, worse with ambulation, former smoker, has chronic LBP but neg NCS in past per pt.  Pt denies chest pain, increased sob or doe, wheezing, orthopnea, PND, increased LE swelling, palpitations, dizziness or syncope.  Pt denies new neurological symptoms such as new headache, or facial or extremity weakness or numbness   Pt denies polydipsia, polyuria, Past Medical History  Diagnosis Date  . Hyperlipidemia   . HTN (hypertension)   . Cervical cancer   . Fibromyalgia   . Glaucoma   . Anxiety   . IBS (irritable bowel syndrome)   . Internal and external hemorrhoids without complication   . Depression   . Cervical disc disease    Past Surgical History  Procedure Date  . Cholecystectomy   . Inguinal hernia repair   . Abdominal hysterectomy   . Oophorectomy   . Colonoscopy     reports that she quit smoking about 3 months ago. Her smoking use included Cigarettes. She has a 30 pack-year smoking history. She has never used smokeless tobacco. She reports that she does not drink alcohol or use illicit drugs. family history is not on file.  She is adopted. Allergies  Allergen Reactions  . Codeine   . Erythromycin   . Penicillins    Current Outpatient Prescriptions on File Prior to Visit  Medication Sig Dispense Refill  . ALPRAZolam (XANAX) 0.5 MG tablet Take 1 tablet (0.5 mg total) by mouth  3 (three) times daily as needed for sleep.  60 tablet  3  . Calcium Acetate, Phos Binder, (CALCIUM ACETATE PO) Take by mouth daily.        Marland Kitchen esomeprazole (NEXIUM) 40 MG capsule Take 1 capsule (40 mg total) by mouth daily.  50 capsule  0  . fish oil-omega-3 fatty acids 1000 MG capsule Take 2 g by mouth daily.        Marland Kitchen GARLIC PO Take by mouth 1 dose over 46 hours.        Marland Kitchen HYDROcodone-acetaminophen (VICODIN ES) 7.5-750 MG per tablet Take 1 tablet by mouth every 6 (six) hours as needed for pain.  100 tablet  3  . lisinopril (PRINIVIL,ZESTRIL) 20 MG tablet TAKE ONE TABLET BY MOUTH EVERY DAY  90 tablet  1  . NIACIN PO Take by mouth daily.        Marland Kitchen Specialty Vitamins Products (MAGNESIUM, AMINO ACID CHELATE,) 133 MG tablet Take 1 tablet by mouth 2 (two) times daily.        . temazepam (RESTORIL) 30 MG capsule TAKE ONE CAPSULE BY MOUTH AT BEDTIME AS NEEDED  30 capsule  5  . dicyclomine (BENTYL) 20 MG tablet 1 po tid before meals  60 tablet  2  . Probiotic Product (ALIGN) 4 MG CAPS Take 1 capsule by mouth daily.  70 capsule  0   Current Facility-Administered Medications on File  Prior to Visit  Medication Dose Route Frequency Provider Last Rate Last Dose  . 0.9 %  sodium chloride infusion  500 mL Intravenous Continuous Iva Boop, MD        Review of Systems Review of Systems  Constitutional: Negative for diaphoresis and unexpected weight change.  HENT: Negative for drooling and tinnitus.   Eyes: Negative for photophobia and visual disturbance.  Respiratory: Negative for choking and stridor.   Gastrointestinal: Negative for vomiting and blood in stool.  Genitourinary: Negative for hematuria and decreased urine volume.  Musculoskeletal: Negative for gait problem.  Skin: Negative for color change and wound.  Neurological: Negative for tremors and numbness.     Objective:   Physical Exam BP 120/86  Pulse 68  Temp 98.2 F (36.8 C) (Oral)  Ht 4\' 11"  (1.499 m)  Wt 116 lb 2 oz (52.674 kg)   BMI 23.45 kg/m2  SpO2 96% Physical Exam  VS noted, not ill appearing Constitutional: Pt appears well-developed and well-nourished.  HENT: Head: Normocephalic.  Right Ear: External ear normal.  Left Ear: External ear normal.  Bilat tm's mild erythema.  Sinus nontender.  Pharynx no erythema, except for gum at right post molar 1+ red,tender, swelling Eyes: Conjunctivae and EOM are normal. Pupils are equal, round, and reactive to light.  Neck: Normal range of motion. Neck supple.  Cardiovascular: Normal rate and regular rhythm.   Pulmonary/Chest: Effort normal and breath sounds normal.  Neurological: Pt is alert. Not confused, motor/dtr/gait intact Skin: Skin is warm. No erythema. trace bilat dorsalis pedis bilat Psychiatric: Pt behavior is normal. Thought content normal. 1+ nervous    Assessment & Plan:

## 2011-12-02 ENCOUNTER — Ambulatory Visit: Payer: Medicare PPO | Admitting: Internal Medicine

## 2011-12-03 ENCOUNTER — Other Ambulatory Visit: Payer: Self-pay | Admitting: *Deleted

## 2011-12-03 DIAGNOSIS — I739 Peripheral vascular disease, unspecified: Secondary | ICD-10-CM

## 2011-12-04 ENCOUNTER — Encounter (INDEPENDENT_AMBULATORY_CARE_PROVIDER_SITE_OTHER): Payer: Medicare PPO

## 2011-12-04 DIAGNOSIS — I739 Peripheral vascular disease, unspecified: Secondary | ICD-10-CM

## 2011-12-05 ENCOUNTER — Encounter: Payer: Self-pay | Admitting: Internal Medicine

## 2011-12-15 ENCOUNTER — Ambulatory Visit (INDEPENDENT_AMBULATORY_CARE_PROVIDER_SITE_OTHER): Payer: Medicare PPO | Admitting: Internal Medicine

## 2011-12-15 ENCOUNTER — Encounter: Payer: Self-pay | Admitting: Internal Medicine

## 2011-12-15 VITALS — BP 122/72 | HR 84 | Temp 98.4°F | Resp 16 | Wt 119.0 lb

## 2011-12-15 DIAGNOSIS — I10 Essential (primary) hypertension: Secondary | ICD-10-CM

## 2011-12-15 NOTE — Assessment & Plan Note (Signed)
Her BP is well controlled 

## 2011-12-15 NOTE — Progress Notes (Signed)
  Subjective:    Patient ID: Barbara [name], female    DOB: 1960/04/24, 51 y.o.   MRN: [id number]  Hypertension This is a chronic problem. The current episode started more than 1 year ago. The problem has been gradually improving since onset. The problem is controlled. Associated symptoms include anxiety and neck pain (unchanged). Pertinent negatives include no blurred vision, chest pain, headaches, malaise/fatigue, orthopnea, palpitations, peripheral edema, PND, shortness of breath or sweats. Past treatments include ACE inhibitors. The current treatment provides moderate improvement. Compliance problems include exercise and diet.       Review of Systems  Constitutional: Negative.  Negative for malaise/fatigue.  HENT: Positive for neck pain (unchanged).   Eyes: Negative.  Negative for blurred vision.  Respiratory: Negative for cough, chest tightness, shortness of breath, wheezing and stridor.   Cardiovascular: Negative for chest pain, palpitations, orthopnea, leg swelling and PND.  Gastrointestinal: Negative for nausea, vomiting, abdominal pain, diarrhea, constipation and blood in stool.  Genitourinary: Negative.   Musculoskeletal: Positive for back pain (unchanged). Negative for myalgias, joint swelling, arthralgias and gait problem.  Skin: Negative.   Neurological: Negative.  Negative for headaches.  Hematological: Negative for adenopathy. Does not bruise/bleed easily.  Psychiatric/Behavioral: Negative.        Objective:   Physical Exam  Vitals reviewed. Constitutional: She is oriented to person, place, and time. She appears well-developed and well-nourished. No distress.  HENT:  Head: Normocephalic and atraumatic.  Mouth/Throat: Oropharynx is clear and moist. No oropharyngeal exudate.  Eyes: Conjunctivae normal are normal. Right eye exhibits no discharge. Left eye exhibits no discharge. No scleral icterus.  Neck: Normal range of motion. Neck supple. No JVD present. No tracheal  deviation present. No thyromegaly present.  Cardiovascular: Normal rate, regular rhythm, normal heart sounds and intact distal pulses.  Exam reveals no gallop and no friction rub.   No murmur heard. Pulmonary/Chest: Effort normal and breath sounds normal. No stridor. No respiratory distress. She has no wheezes. She has no rales. She exhibits no tenderness.  Abdominal: Soft. Bowel sounds are normal. She exhibits no distension and no mass. There is no tenderness. There is no rebound and no guarding.  Musculoskeletal: Normal range of motion. She exhibits no edema and no tenderness.  Lymphadenopathy:    She has no cervical adenopathy.  Neurological: She is alert and oriented to person, place, and time.  Skin: Skin is warm and dry. No rash noted. She is not diaphoretic. No erythema. No pallor.  Psychiatric: She has a normal mood and affect. Her behavior is normal. Judgment and thought content normal.      Lab Results  Component Value Date   WBC 9.9 05/02/2011   HGB 13.5 05/02/2011   HCT 40.0 05/02/2011   PLT 315.0 05/02/2011   GLUCOSE 96 09/23/2011   CHOL 191 09/23/2011   TRIG 127.0 09/23/2011   HDL 55.40 09/23/2011   LDLDIRECT 154.5 05/02/2011   LDLCALC 110* 09/23/2011   ALT 14 09/23/2011   AST 18 09/23/2011   NA 140 09/23/2011   K 4.8 09/23/2011   CL 103 09/23/2011   CREATININE 0.7 09/23/2011   BUN 15 09/23/2011   CO2 27 09/23/2011   TSH 0.79 05/02/2011      Assessment & Plan:

## 2011-12-23 ENCOUNTER — Emergency Department (HOSPITAL_COMMUNITY)
Admission: EM | Admit: 2011-12-23 | Discharge: 2011-12-23 | Disposition: A | Discharge status: Home / Self Care | Payer: Medicare PPO | Attending: Emergency Medicine | Admitting: Emergency Medicine

## 2011-12-23 ENCOUNTER — Encounter (HOSPITAL_COMMUNITY): Payer: Self-pay | Admitting: *Deleted

## 2011-12-23 DIAGNOSIS — R109 Unspecified abdominal pain: Secondary | ICD-10-CM | POA: Insufficient documentation

## 2011-12-23 LAB — COMPREHENSIVE METABOLIC PANEL
ALT: 17 U/L (ref 0–35)
Alkaline Phosphatase: 87 U/L (ref 39–117)
BUN: 17 mg/dL (ref 6–23)
CO2: 28 mEq/L (ref 19–32)
GFR calc Af Amer: >90 mL/min (ref >90)
GFR calc non Af Amer: >90 mL/min (ref >90)
Glucose, Bld: 95 mg/dL (ref 70–99)
Potassium: 4 mEq/L (ref 3.5–5.1)
Total Protein: 7.2 g/dL (ref 6.0–8.3)

## 2011-12-23 LAB — URINALYSIS, ROUTINE W REFLEX MICROSCOPIC
Bilirubin Urine: NEGATIVE
Ketones, ur: NEGATIVE mg/dL
Nitrite: NEGATIVE
Protein, ur: NEGATIVE mg/dL
Urobilinogen, UA: 0.2 mg/dL (ref 0.0–1.0)
pH: 6.5 (ref 5.0–8.0)

## 2011-12-23 LAB — CBC
HCT: 36.8 % (ref 36.0–46.0)
Hemoglobin: 12.1 g/dL (ref 12.0–15.0)
MCH: 30.8 pg (ref 26.0–34.0)
MCHC: 32.9 g/dL (ref 30.0–36.0)
RBC: 3.93 MIL/uL (ref 3.87–5.11)

## 2011-12-23 LAB — URINE MICROSCOPIC-ADD ON

## 2011-12-23 NOTE — ED Notes (Signed)
Patient with right sided abdominal pain that is stabbing in nature this am and pain went away.  Patient went out to lunch and the pain returned intermittently stabbing in nature.  Patient states that the pain has increased and the "stabbing" is intermittent but the dull ache is constant.  Patient states that the pain is radiating to her back.  Patient took vicodin 7.5/750 around 2pm and the pain did not go away.

## 2011-12-24 ENCOUNTER — Ambulatory Visit (INDEPENDENT_AMBULATORY_CARE_PROVIDER_SITE_OTHER)
Admission: RE | Admit: 2011-12-24 | Discharge: 2011-12-24 | Disposition: A | Discharge status: Home / Self Care | Payer: Medicare PPO | Source: Ambulatory Visit | Attending: Internal Medicine | Admitting: Internal Medicine

## 2011-12-24 ENCOUNTER — Ambulatory Visit (INDEPENDENT_AMBULATORY_CARE_PROVIDER_SITE_OTHER): Payer: Medicare PPO | Admitting: Internal Medicine

## 2011-12-24 ENCOUNTER — Other Ambulatory Visit (INDEPENDENT_AMBULATORY_CARE_PROVIDER_SITE_OTHER): Payer: Medicare PPO

## 2011-12-24 ENCOUNTER — Encounter: Payer: Self-pay | Admitting: Internal Medicine

## 2011-12-24 VITALS — BP 130/90 | HR 110 | Temp 98.1°F | Resp 16

## 2011-12-24 DIAGNOSIS — R10811 Right upper quadrant abdominal tenderness: Secondary | ICD-10-CM | POA: Insufficient documentation

## 2011-12-24 DIAGNOSIS — R319 Hematuria, unspecified: Secondary | ICD-10-CM

## 2011-12-24 DIAGNOSIS — I10 Essential (primary) hypertension: Secondary | ICD-10-CM

## 2011-12-24 LAB — URINALYSIS, ROUTINE W REFLEX MICROSCOPIC
Bilirubin Urine: NEGATIVE
Leukocytes, UA: NEGATIVE
Nitrite: NEGATIVE
Urobilinogen, UA: 0.2 (ref 0.0–1.0)
pH: 5.5 (ref 5.0–8.0)

## 2011-12-24 MED ORDER — IOHEXOL 300 MG/ML  SOLN
100.0000 mL | Freq: Once | INTRAMUSCULAR | Status: AC | PRN
  Administered 2011-12-24: 100 mL via INTRAVENOUS

## 2011-12-24 MED ORDER — KETOROLAC TROMETHAMINE 60 MG/2ML IM SOLN
60.0000 mg | Freq: Once | INTRAMUSCULAR | Status: AC
  Administered 2011-12-24: 60 mg via INTRAMUSCULAR

## 2011-12-24 NOTE — Patient Instructions (Signed)

## 2011-12-24 NOTE — Progress Notes (Signed)
Subjective:    Patient ID: Barbara [name], female    DOB: Jul 25, 1960, 51 y.o.   MRN: [id number]  Abdominal Pain This is a new problem. The current episode started yesterday. The onset quality is sudden. The problem occurs constantly. The most recent episode lasted 1 day. The problem has been unchanged. The pain is located in the RUQ. The pain is at a severity of 3/10 ("stabbing"). The pain is moderate. Quality: "Stabbing" The abdominal pain does not radiate. Associated symptoms include diarrhea and frequency. Pertinent negatives include no anorexia, arthralgias, belching, constipation, dysuria, fever, flatus, headaches, hematochezia, melena, myalgias, nausea, vomiting or weight loss. Nothing aggravates the pain. The pain is relieved by nothing. She has tried acetaminophen and oral narcotic analgesics for the symptoms. The treatment provided mild relief. Her past medical history is significant for abdominal surgery and irritable bowel syndrome.      Review of Systems  Constitutional: Negative for fever, chills, weight loss, diaphoresis, activity change, appetite change, fatigue and unexpected weight change.  HENT: Negative.   Eyes: Negative.   Respiratory: Negative.   Cardiovascular: Negative.   Gastrointestinal: Positive for abdominal pain and diarrhea. Negative for nausea, vomiting, constipation, blood in stool, melena, hematochezia, abdominal distention, anal bleeding, rectal pain, anorexia and flatus.  Genitourinary: Positive for frequency. Negative for dysuria, flank pain, enuresis, difficulty urinating and dyspareunia.  Musculoskeletal: Negative for myalgias, back pain, joint swelling, arthralgias and gait problem.  Skin: Negative.   Neurological: Negative.  Negative for headaches.  Hematological: Negative for adenopathy. Does not bruise/bleed easily.  Psychiatric/Behavioral: Negative.        Objective:   Physical Exam  Vitals reviewed. Constitutional: She is oriented to person,  place, and time. She appears well-developed and well-nourished.  Non-toxic appearance. She does not have a sickly appearance. She does not appear ill. She appears distressed (in pain).  HENT:  Head: Normocephalic and atraumatic.  Mouth/Throat: Oropharynx is clear and moist. No oropharyngeal exudate.  Eyes: Conjunctivae normal are normal. Right eye exhibits no discharge. Left eye exhibits no discharge. No scleral icterus.  Neck: Normal range of motion. Neck supple. No JVD present. No tracheal deviation present. No thyromegaly present.  Cardiovascular: Normal rate, regular rhythm, normal heart sounds and intact distal pulses.  Exam reveals no gallop and no friction rub.   No murmur heard. Pulmonary/Chest: Effort normal and breath sounds normal. No stridor. No respiratory distress. She has no wheezes. She has no rales. She exhibits no tenderness.  Abdominal: Soft. Normal appearance and bowel sounds are normal. She exhibits no shifting dullness, no distension, no pulsatile liver, no fluid wave, no abdominal bruit, no ascites, no pulsatile midline mass and no mass. There is no hepatosplenomegaly, splenomegaly or hepatomegaly. There is tenderness in the right upper quadrant. There is CVA tenderness (right side only). There is no rebound and no guarding. No hernia. Hernia confirmed negative in the ventral area, confirmed negative in the right inguinal area and confirmed negative in the left inguinal area.  Musculoskeletal: Normal range of motion. She exhibits no edema and no tenderness.  Lymphadenopathy:    She has no cervical adenopathy.  Neurological: She is oriented to person, place, and time.  Skin: Skin is warm and dry. No rash noted. She is not diaphoretic. No erythema. No pallor.  Psychiatric: She has a normal mood and affect. Her behavior is normal. Judgment and thought content normal.      Lab Results  Component Value Date   WBC 8.3 12/23/2011   HGB 12.1 12/23/2011  HCT 36.8 12/23/2011   PLT  298 12/23/2011   GLUCOSE 95 12/23/2011   CHOL 191 09/23/2011   TRIG 127.0 09/23/2011   HDL 55.40 09/23/2011   LDLDIRECT 154.5 05/02/2011   LDLCALC 110* 09/23/2011   ALT 17 12/23/2011   AST 18 12/23/2011   NA 142 12/23/2011   K 4.0 12/23/2011   CL 102 12/23/2011   CREATININE 0.78 12/23/2011   BUN 17 12/23/2011   CO2 28 12/23/2011   TSH 0.79 05/02/2011      Assessment & Plan:

## 2011-12-26 ENCOUNTER — Ambulatory Visit (INDEPENDENT_AMBULATORY_CARE_PROVIDER_SITE_OTHER): Payer: Medicare PPO | Admitting: Pulmonary Disease

## 2011-12-26 ENCOUNTER — Encounter: Payer: Self-pay | Admitting: Pulmonary Disease

## 2011-12-26 ENCOUNTER — Telehealth: Payer: Self-pay | Admitting: Internal Medicine

## 2011-12-26 VITALS — BP 122/82 | HR 64 | Temp 97.9°F | Ht 59.0 in | Wt 121.6 lb

## 2011-12-26 DIAGNOSIS — G4733 Obstructive sleep apnea (adult) (pediatric): Secondary | ICD-10-CM | POA: Insufficient documentation

## 2011-12-26 DIAGNOSIS — R0609 Other forms of dyspnea: Secondary | ICD-10-CM

## 2011-12-26 DIAGNOSIS — R0683 Snoring: Secondary | ICD-10-CM

## 2011-12-26 NOTE — Patient Instructions (Signed)
Will schedule sleep study Will call to schedule follow up after sleep study reviewed 

## 2011-12-26 NOTE — Assessment & Plan Note (Signed)
She reports snoring, sleep disruption, and daytime sleepiness.  She has history of hypertension, and depression.  I am concerned she could have sleep apnea.  I have explained how sleep apnea can affect the patient's health.  Driving precautions and importance of weight loss were discussed.  Treatment options for sleep apnea were reviewed.  To further assess will arrange for in lab sleep study.

## 2011-12-26 NOTE — Progress Notes (Signed)
Chief Complaint  Patient presents with  . Advice Only    wakes up several times during the night, feels tired during the day, has pain in side at night and can't sleep due to that as well    History of Present Illness: Barbara [name] is a 51 y.o. female for evaluation of sleep trouble.  She was recently seen by primary care.  Her husband reported that she snores, and has trouble with her sleep.  She was therefore referred for further assessment.  Her snoring has gotten worse since she had her upper teeth removed.  She wears dentures.  She will wake up at times during a dream feeling like she can't catch her breath.  Her husband sleeps in a separate room because of her snoring.  She takes restoril at 1030 pm.  She has been using this for years.  She was using [place] before, but this caused hangover.  She falls asleep quickly.  She wakes up after a few hours due to back discomfort.  She will fall back to sleep, and then get up at 7 am.  She has been feeling more sluggish in the mornings.  She takes a nap for 1 to 2 hours per day.  She is not using anything to help keep her awake.  She had a back injury after a fall, and has throbbing in her legs as a result.  Epworth score is 11 out of 24.  The patient denies sleep walking, sleep talking, bruxism, or nightmares.  There is no history of restless legs.  The patient denies sleep hallucinations, sleep paralysis, or cataplexy.   Past Medical History  Diagnosis Date  . Hyperlipidemia   . HTN (hypertension)   . Cervical cancer   . Fibromyalgia   . Glaucoma   . Anxiety   . IBS (irritable bowel syndrome)   . Internal and external hemorrhoids without complication   . Depression   . Cervical disc disease     Past Surgical History  Procedure Date  . Cholecystectomy   . Inguinal hernia repair   . Abdominal hysterectomy   . Oophorectomy   . Colonoscopy   . Breast lumpectomy     Current Outpatient Prescriptions on File Prior to Visit    Medication Sig Dispense Refill  . ALPRAZolam (XANAX) 0.5 MG tablet Take 0.5 mg by mouth 3 (three) times daily as needed. For anxiety      . fish oil-omega-3 fatty acids 1000 MG capsule Take 1 g by mouth daily.       Marland Kitchen HYDROcodone-acetaminophen (VICODIN ES) 7.5-750 MG per tablet Take 1 tablet by mouth every 6 (six) hours as needed. For pain      . lisinopril (PRINIVIL,ZESTRIL) 20 MG tablet Take 20 mg by mouth daily.      . Multiple Vitamins-Minerals (ALIVE WOMENS 50+ PO) Take 1 tablet by mouth daily.      Marland Kitchen Specialty Vitamins Products (MAGNESIUM, AMINO ACID CHELATE,) 133 MG tablet Take 1 tablet by mouth 2 (two) times daily.        . temazepam (RESTORIL) 30 MG capsule Take 30 mg by mouth at bedtime as needed. For insomnia       Current Facility-Administered Medications on File Prior to Visit  Medication Dose Route Frequency Provider Last Rate Last Dose  . 0.9 %  sodium chloride infusion  500 mL Intravenous Continuous Iva Boop, MD        Allergies  Allergen Reactions  . Codeine   .  Erythromycin   . Penicillins     Family History  Problem Relation Age of Onset  . Adopted: Yes    History  Substance Use Topics  . Smoking status: Former Smoker -- 1.0 packs/day for 30 years    Types: Cigarettes    Quit date: 08/07/2011  . Smokeless tobacco: Never Used  . Alcohol Use: Yes     1-2 glasses of wine friday night    Review of Systems  Constitutional: Negative for fever, appetite change and unexpected weight change.  HENT: Negative for ear pain, congestion, sore throat, sneezing, trouble swallowing and dental problem.   Respiratory: Negative for cough and shortness of breath.   Cardiovascular: Negative for chest pain, palpitations and leg swelling.  Gastrointestinal: Positive for abdominal pain.  Musculoskeletal: Positive for arthralgias. Negative for joint swelling.  Skin: Negative for rash.  Neurological: Positive for headaches.  Psychiatric/Behavioral: Positive for dysphoric  mood. The patient is nervous/anxious.    Physical Exam: Filed Vitals:   12/26/11 1545  BP: 122/82  Pulse: 64  Temp: 97.9 F (36.6 C)  TempSrc: Oral  Height: 4\' 11"  (1.499 m)  Weight: 121 lb 9.6 oz (55.157 kg)  SpO2: 98%  ,  Current Encounter SPO2  12/26/11 1545 98%  12/24/11 1045 97%  12/23/11 1620 97%    Wt Readings from Last 3 Encounters:  12/26/11 121 lb 9.6 oz (55.157 kg)  12/15/11 119 lb (53.978 kg)  11/27/11 116 lb 2 oz (52.674 kg)    Body mass index is 24.56 kg/(m^2).   General - No distress ENT - TM clear, no sinus tenderness, no oral exudate, wears dentures, retrognathic, no LAN, no thyromegaly Cardiac - s1s2 regular, no murmur, pulses symmetric, no edema Chest - normal respiratory excursion, good air entry, no wheeze/rales/dullness Back - no focal tenderness Abd - soft, non-tender, no organomegaly, + bowel sounds Ext - normal motor strength Neuro - Cranial nerves are normal. PERLA. EOM's intact. Skin - no discernible active dermatitis, erythema, urticaria or inflammatory process. Psych - normal mood, and behavior.   Lab Results  Component Value Date   WBC 8.3 12/23/2011   HGB 12.1 12/23/2011   HCT 36.8 12/23/2011   MCV 93.6 12/23/2011   PLT 298 12/23/2011    Lab Results  Component Value Date   CREATININE 0.78 12/23/2011   BUN 17 12/23/2011   NA 142 12/23/2011   K 4.0 12/23/2011   CL 102 12/23/2011   CO2 28 12/23/2011    Lab Results  Component Value Date   ALT 17 12/23/2011   AST 18 12/23/2011   ALKPHOS 87 12/23/2011   BILITOT 0.2* 12/23/2011    Lab Results  Component Value Date   TSH 0.79 05/02/2011    Assessment/Plan:  Barbara Helling, MD [place] [facility]/Sleep Pager:  [phone] 12/26/2011, 3:55 PM

## 2011-12-26 NOTE — Progress Notes (Deleted)
  Subjective:    Patient ID: Barbara [name], female    DOB: 14-Dec-1960, 51 y.o.   MRN: [id number]  HPI    Review of Systems  Constitutional: Negative for fever, appetite change and unexpected weight change.  HENT: Negative for ear pain, congestion, sore throat, sneezing, trouble swallowing and dental problem.   Respiratory: Negative for cough and shortness of breath.   Cardiovascular: Negative for chest pain, palpitations and leg swelling.  Gastrointestinal: Positive for abdominal pain.  Musculoskeletal: Positive for arthralgias. Negative for joint swelling.  Skin: Negative for rash.  Neurological: Positive for headaches.  Psychiatric/Behavioral: Positive for dysphoric mood. The patient is nervous/anxious.        Objective:   Physical Exam        Assessment & Plan:

## 2011-12-26 NOTE — Telephone Encounter (Signed)
Pt came into the office stating that she was here for an appt on 12/15/11 and she ask Dr. Yetta Barre to write a letter (Schedule A) for her disability, pt have not heard anything back about the letter and pt has to have it by 01/02/12. Pt also stated that her side still in pain and was wondering what else can we do about that, pt is aware that the CT result was normal, but she is concern due to the pain still there. Please advise.

## 2011-12-28 ENCOUNTER — Encounter: Payer: Self-pay | Admitting: Internal Medicine

## 2011-12-28 NOTE — Assessment & Plan Note (Signed)
I will recheck her UA and a urine clx and will get a CT done to look for renal stones

## 2011-12-28 NOTE — Assessment & Plan Note (Signed)
She was in the ER overnight and had labs done that showed a small amount of blood in her urine so I have asked her to get a CT scan done to see if she has renal stones, will also clx her urine to see if she has a UTI, she will take her current meds for pain

## 2011-12-28 NOTE — Telephone Encounter (Signed)
I have not had a chance to write the letter yet, she should use her usual meds for the pain and let me know about any changes in her symptoms

## 2011-12-28 NOTE — Assessment & Plan Note (Signed)
Her BP is well controlled 

## 2011-12-30 ENCOUNTER — Encounter: Payer: Self-pay | Admitting: Internal Medicine

## 2012-01-16 ENCOUNTER — Ambulatory Visit (HOSPITAL_BASED_OUTPATIENT_CLINIC_OR_DEPARTMENT_OTHER): Payer: Medicare PPO | Attending: Pulmonary Disease | Admitting: Radiology

## 2012-01-16 VITALS — Ht 59.0 in | Wt 118.0 lb

## 2012-01-16 DIAGNOSIS — F3289 Other specified depressive episodes: Secondary | ICD-10-CM | POA: Insufficient documentation

## 2012-01-16 DIAGNOSIS — I10 Essential (primary) hypertension: Secondary | ICD-10-CM | POA: Insufficient documentation

## 2012-01-16 DIAGNOSIS — G4733 Obstructive sleep apnea (adult) (pediatric): Secondary | ICD-10-CM | POA: Insufficient documentation

## 2012-01-16 DIAGNOSIS — R0683 Snoring: Secondary | ICD-10-CM

## 2012-01-16 DIAGNOSIS — F329 Major depressive disorder, single episode, unspecified: Secondary | ICD-10-CM | POA: Insufficient documentation

## 2012-01-28 ENCOUNTER — Telehealth: Payer: Self-pay | Admitting: Internal Medicine

## 2012-01-28 ENCOUNTER — Encounter: Payer: Self-pay | Admitting: Internal Medicine

## 2012-01-28 ENCOUNTER — Ambulatory Visit (INDEPENDENT_AMBULATORY_CARE_PROVIDER_SITE_OTHER): Payer: Medicare PPO | Admitting: Internal Medicine

## 2012-01-28 VITALS — BP 128/82 | HR 72 | Temp 98.2°F | Resp 16 | Ht 60.0 in | Wt 128.0 lb

## 2012-01-28 DIAGNOSIS — L509 Urticaria, unspecified: Secondary | ICD-10-CM

## 2012-01-28 DIAGNOSIS — L259 Unspecified contact dermatitis, unspecified cause: Secondary | ICD-10-CM

## 2012-01-28 MED ORDER — PREDNISONE 10 MG PO TABS: ORAL_TABLET | ORAL | Status: DC

## 2012-01-28 MED ORDER — TRIAMCINOLONE ACETONIDE 0.1 % EX CREA: TOPICAL_CREAM | Freq: Two times a day (BID) | CUTANEOUS | Status: DC
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
## 2012-01-28 NOTE — Patient Instructions (Signed)

## 2012-01-29 DIAGNOSIS — G4733 Obstructive sleep apnea (adult) (pediatric): Secondary | ICD-10-CM

## 2012-01-30 ENCOUNTER — Telehealth: Payer: Self-pay | Admitting: Pulmonary Disease

## 2012-01-30 NOTE — Telephone Encounter (Signed)
PSG 01/16/12>>AHI 0.5, RDI 7.3, SpO2 low 94%, increased spindle density, sleep disruption from respiratory events, PLMI 0.  Will have my nurse schedule ROV to review results.

## 2012-01-31 NOTE — Procedures (Signed)
Barbara [name], Barbara [name] NO.:  [id number]  MEDICAL RECORD NO.:  [id number]          PATIENT TYPE:  OUT  LOCATION:  SLEEP CENTER                 FACILITY:  [facility]  PHYSICIAN:  Coralyn Helling, MD        DATE OF BIRTH:  06-21-1960  DATE OF STUDY:  02/16/2012                           NOCTURNAL POLYSOMNOGRAM  REFERRING PHYSICIAN:  Coralyn Helling, MD  FACILITY[facility]  [facility].  REFERRING PHYSICIAN:  Coralyn Helling, MD  INDICATION:  Barbara [name] is a 51 year old female, who has a history of hypertension and depression.  She also has snoring, sleep disruption, and daytime sleepiness.  She is therefore referred to sleep lab for evaluation of hypersomnia with obstructive sleep apnea.  Height is 4 feet 11 inches, weight is 118 pounds, BMI is 24, neck size 12.5 inches.  MEDICATIONS:  Lisinopril, Xanax, temazepam, and Vicodin.  EPWORTH SLEEPINESS SCORE:  9.  SLEEP ARCHITECTURE:  Total recording time was 388 minutes, total sleep time was 230 minutes.  Sleep efficiency was 59%, sleep latency was 9 minutes.  REM latency was 242 minutes. The study was notable for the lack of slow-wave sleep and she slept predominantly in the nonsupine position.  Of note is that she had difficulty with sleep initiation, sleep maintenance due to respiratory events.  RESPIRATORY DATA:  The average respiratory rate was 14.  Loud snoring was noted by the technician.  The overall apnea/hypopnea index was 0.5. The respiratory disturbance index was 7.3.  The events were exclusively obstructive in nature.  OXYGEN DATA:  The baseline oxygenation was 97%.  The oxygen saturation nadir was 93%.  The study was conducted without the use of supplemental oxygen.  CARDIAC DATA:  The average heart rate was 60.  The rhythm strip showed sinus rhythm with occasional PACs.  MOVEMENT/PARASOMNIA:  The periodic limb movement index was 0 and the patient had 1 restroom trip.  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  Additional therapeutic options could include CPAP therapy, oral appliance, or surgical intervention.     Coralyn Helling, MD Diplomat, American Board of Sleep Medicine    VS/MEDQ  D:  01/30/2012 12:44:26  T:  01/31/2012 12:25:05  Job:  [id number]

## 2012-02-02 NOTE — Telephone Encounter (Signed)
I spoke with pt and is scheduled to come in 02/13/12 at 9 am

## 2012-02-09 ENCOUNTER — Other Ambulatory Visit: Payer: Self-pay | Admitting: Internal Medicine

## 2012-02-09 NOTE — Telephone Encounter (Signed)
Please advise if ok to refill xanax 0.5mg  1 bid prn #60 and hydrocodone 7.5-750 1 every 6 hrs prn #100. Medications last filled 09/23/11.

## 2012-02-10 ENCOUNTER — Telehealth: Payer: Self-pay

## 2012-02-10 NOTE — Telephone Encounter (Signed)
Please advise if ok to refill xanax 0.5mg  1 bid prn #60 and hydrocodone 7.5-750 1 every 6 hrs prn #100. Medications last filled 09/23/11. Please advise in TLJ absence

## 2012-02-10 NOTE — Telephone Encounter (Signed)
OK to fill both Rx - no ref Needs OV w/Dr Yetta Barre Thank you!

## 2012-02-11 MED ORDER — HYDROCODONE-ACETAMINOPHEN 7.5-750 MG PO TABS: 1.0000 | ORAL_TABLET | Freq: Four times a day (QID) | ORAL | Status: DC | PRN

## 2012-02-11 MED ORDER — ALPRAZOLAM 0.5 MG PO TABS: 0.5000 mg | ORAL_TABLET | Freq: Three times a day (TID) | ORAL | Status: DC | PRN

## 2012-02-11 NOTE — Telephone Encounter (Signed)
RX called into pharmacy

## 2012-02-11 NOTE — Telephone Encounter (Signed)
Pharmacy calling to get MD name for the Vicodin ES and Xanax that were called in earlier by [facility]. Same is Dr. Posey Rea.

## 2012-02-13 ENCOUNTER — Ambulatory Visit (INDEPENDENT_AMBULATORY_CARE_PROVIDER_SITE_OTHER): Payer: Medicare PPO | Admitting: Pulmonary Disease

## 2012-02-13 ENCOUNTER — Encounter: Payer: Self-pay | Admitting: Pulmonary Disease

## 2012-02-13 VITALS — BP 124/80 | HR 66 | Temp 97.7°F | Ht 59.0 in | Wt 129.6 lb

## 2012-02-13 DIAGNOSIS — G4733 Obstructive sleep apnea (adult) (pediatric): Secondary | ICD-10-CM

## 2012-02-13 NOTE — Assessment & Plan Note (Signed)
She has borderline OSA based on RDI criteria, but not AHI criteria.  She does not think she could tolerate using a CPAP machine.  She is not a candidate for oral appliance due to her dental problems.    Advised her to monitor her sleep pattern.  If her sleep gets worse, then she can call for follow up and can revisit whether she would want to try additional interventions for her sleep apnea.

## 2012-02-13 NOTE — Progress Notes (Signed)
Chief Complaint  Patient presents with  . Follow-up    discuss sleep study results.     History of Present Illness: Barbara [name] is a 51 y.o. female with mild sleep apnea.  She is here to review her sleep study.  She tried using CPAP mask at beginning of study, and does not think she could use this at home.  She wears dentures, and needs more dental work.  Tests: PSG 01/16/12>>AHI 0.5, RDI 7.3, SpO2 low 94%, increased spindle density, sleep disruption from respiratory events, PLMI 0.  Past Medical History  Diagnosis Date  . Hyperlipidemia   . HTN (hypertension)   . Cervical cancer   . Fibromyalgia   . Glaucoma(365)   . Anxiety   . IBS (irritable bowel syndrome)   . Internal and external hemorrhoids without complication   . Depression   . Cervical disc disease     Past Surgical History  Procedure Date  . Cholecystectomy   . Inguinal hernia repair   . Abdominal hysterectomy   . Oophorectomy   . Colonoscopy   . Breast lumpectomy     Current Outpatient Prescriptions on File Prior to Visit  Medication Sig Dispense Refill  . ALPRAZolam (XANAX) 0.5 MG tablet Take 1 tablet (0.5 mg total) by mouth 3 (three) times daily as needed. For anxiety  90 tablet  0  . fish oil-omega-3 fatty acids 1000 MG capsule Take 1 g by mouth daily.       Marland Kitchen HYDROcodone-acetaminophen (VICODIN ES) 7.5-750 MG per tablet Take 1 tablet by mouth every 6 (six) hours as needed. For pain  100 tablet  0  . lisinopril (PRINIVIL,ZESTRIL) 20 MG tablet Take 20 mg by mouth daily.      . Multiple Vitamins-Minerals (ALIVE WOMENS 50+ PO) Take 1 tablet by mouth daily.      Marland Kitchen Specialty Vitamins Products (MAGNESIUM, AMINO ACID CHELATE,) 133 MG tablet Take 1 tablet by mouth 2 (two) times daily.        . temazepam (RESTORIL) 30 MG capsule Take 30 mg by mouth at bedtime as needed. For insomnia      . triamcinolone cream (KENALOG) 0.1 % Apply topically 2 (two) times daily.  30 g  0   Current Facility-Administered  Medications on File Prior to Visit  Medication Dose Route Frequency Provider Last Rate Last Dose  . 0.9 %  sodium chloride infusion  500 mL Intravenous Continuous Iva Boop, MD        Allergies  Allergen Reactions  . Codeine   . Erythromycin   . Penicillins     Physical Exam: Filed Vitals:   02/13/12 0905 02/13/12 0907  BP:  124/80  Pulse:  66  Temp: 97.7 F (36.5 C)   TempSrc: Oral   Height: 4\' 11"  (1.499 m)   Weight: 129 lb 9.6 oz (58.786 kg)   SpO2:  97%  ,  Current Encounter SPO2  02/13/12 0907 97%  12/26/11 1545 98%  12/24/11 1045 97%    Wt Readings from Last 3 Encounters:  02/13/12 129 lb 9.6 oz (58.786 kg)  01/28/12 128 lb (58.06 kg)  01/16/12 118 lb (53.524 kg)    Body mass index is 26.18 kg/(m^2).   General - No distress  ENT - No sinus tenderness, no oral exudate, wears dentures, retrognathic, no LAN, no thyromegaly  Cardiac - s1s2 regular, no murmur Chest - normal respiratory excursion, good air entry, no wheeze/rales/dullness  Back - no focal tenderness  Abd - soft,  non-tender  Ext - no edema Neuro - normal strength Skin - no rashes Psych - normal mood, and behavior.  Assessment/Plan:  Coralyn Helling, MD [place] [facility]/Critical [facility]/Sleep Pager:  [phone] 02/13/2012, 9:30 AM

## 2012-02-13 NOTE — Patient Instructions (Signed)
Follow up as needed

## 2012-02-13 NOTE — Addendum Note (Signed)
Addended by: Tommie Sams on: 02/13/2012 11:57 AM   Modules accepted: Orders

## 2012-03-11 ENCOUNTER — Other Ambulatory Visit: Payer: Self-pay | Admitting: Internal Medicine

## 2012-03-15 ENCOUNTER — Other Ambulatory Visit: Payer: Self-pay

## 2012-03-15 MED ORDER — HYDROCODONE-ACETAMINOPHEN 7.5-750 MG PO TABS: 1.0000 | ORAL_TABLET | Freq: Four times a day (QID) | ORAL | Status: DC | PRN

## 2012-03-15 NOTE — Telephone Encounter (Signed)
ok 

## 2012-03-15 NOTE — Telephone Encounter (Signed)
Please advise if ok to refill vicodin es, last filled 02/11/12 #100/0rf. Please advise if ok and if refills

## 2012-03-16 ENCOUNTER — Telehealth: Payer: Self-pay | Admitting: *Deleted

## 2012-03-16 ENCOUNTER — Other Ambulatory Visit: Payer: Self-pay | Admitting: Internal Medicine

## 2012-03-16 DIAGNOSIS — M549 Dorsalgia, unspecified: Secondary | ICD-10-CM

## 2012-03-16 DIAGNOSIS — M5137 Other intervertebral disc degeneration, lumbosacral region: Secondary | ICD-10-CM

## 2012-03-16 DIAGNOSIS — M5030 Other cervical disc degeneration, unspecified cervical region: Secondary | ICD-10-CM

## 2012-03-16 MED ORDER — HYDROCODONE-ACETAMINOPHEN 7.5-325 MG PO TABS: 1.0000 | ORAL_TABLET | Freq: Four times a day (QID) | ORAL | Status: DC | PRN

## 2012-03-16 NOTE — Telephone Encounter (Signed)
done

## 2012-03-16 NOTE — Telephone Encounter (Signed)
Ok to Rf? 

## 2012-03-16 NOTE — Telephone Encounter (Signed)
Pt calling regarding Vicodin rx-pharmacy told her that they no longer make 7.5-750mg  tablets-pt is requesting another medication

## 2012-03-16 NOTE — Telephone Encounter (Signed)
Pt informed of new rx.  

## 2012-03-17 ENCOUNTER — Encounter: Payer: Self-pay | Admitting: Internal Medicine

## 2012-03-17 ENCOUNTER — Ambulatory Visit (INDEPENDENT_AMBULATORY_CARE_PROVIDER_SITE_OTHER): Payer: Medicare PPO | Admitting: Internal Medicine

## 2012-03-17 VITALS — BP 130/80 | HR 79 | Temp 97.8°F | Resp 16 | Wt 130.0 lb

## 2012-03-17 DIAGNOSIS — I10 Essential (primary) hypertension: Secondary | ICD-10-CM

## 2012-03-17 DIAGNOSIS — M549 Dorsalgia, unspecified: Secondary | ICD-10-CM

## 2012-03-17 DIAGNOSIS — M5137 Other intervertebral disc degeneration, lumbosacral region: Secondary | ICD-10-CM

## 2012-03-17 MED ORDER — CELECOXIB 200 MG PO CAPS: 200.0000 mg | ORAL_CAPSULE | Freq: Every day | ORAL | Status: DC

## 2012-03-17 NOTE — Assessment & Plan Note (Signed)
She has been out of vicodin for a few days and therefore the pain increased She will restart vicodin for the pain and will try celebrex as well

## 2012-03-17 NOTE — Assessment & Plan Note (Signed)
Start celebrex 

## 2012-03-17 NOTE — Patient Instructions (Signed)
Back Pain, Adult Low back pain is very common. About 1 in 5 people have back pain.The cause of low back pain is rarely dangerous. The pain often gets better over time.About half of people with a sudden onset of back pain feel better in just 2 weeks. About 8 in 10 people feel better by 6 weeks.  CAUSES Some common causes of back pain include:  Strain of the muscles or ligaments supporting the spine.  Wear and tear (degeneration) of the spinal discs.  Arthritis.  Direct injury to the back. DIAGNOSIS Most of the time, the direct cause of low back pain is not known.However, back pain can be treated effectively even when the exact cause of the pain is unknown.Answering your caregiver's questions about your overall health and symptoms is one of the most accurate ways to make sure the cause of your pain is not dangerous. If your caregiver needs more information, he or she may order lab work or imaging tests (X-rays or MRIs).However, even if imaging tests show changes in your back, this usually does not require surgery. HOME CARE INSTRUCTIONS For many people, back pain returns.Since low back pain is rarely dangerous, it is often a condition that people can learn to manageon their own.   Remain active. It is stressful on the back to sit or stand in one place. Do not sit, drive, or stand in one place for more than 30 minutes at a time. Take short walks on level surfaces as soon as pain allows.Try to increase the length of time you walk each day.  Do not stay in bed.Resting more than 1 or 2 days can delay your recovery.  Do not avoid exercise or work.Your body is made to move.It is not dangerous to be active, even though your back may hurt.Your back will likely heal faster if you return to being active before your pain is gone.  Pay attention to your body when you bend and lift. Many people have less discomfortwhen lifting if they bend their knees, keep the load close to their bodies,and  avoid twisting. Often, the most comfortable positions are those that put less stress on your recovering back.  Find a comfortable position to sleep. Use a firm mattress and lie on your side with your knees slightly bent. If you lie on your back, put a pillow under your knees.  Only take over-the-counter or prescription medicines as directed by your caregiver. Over-the-counter medicines to reduce pain and inflammation are often the most helpful.Your caregiver may prescribe muscle relaxant drugs.These medicines help dull your pain so you can more quickly return to your normal activities and healthy exercise.  Put ice on the injured area.  Put ice in a plastic bag.  Place a towel between your skin and the bag.  Leave the ice on for 15 to 20 minutes, 3 to 4 times a day for the first 2 to 3 days. After that, ice and heat may be alternated to reduce pain and spasms.  Ask your caregiver about trying back exercises and gentle massage. This may be of some benefit.  Avoid feeling anxious or stressed.Stress increases muscle tension and can worsen back pain.It is important to recognize when you are anxious or stressed and learn ways to manage it.Exercise is a great option. SEEK MEDICAL CARE IF:  You have pain that is not relieved with rest or medicine.  You have pain that does not improve in 1 week.  You have new symptoms.  You are generally   not feeling well. SEEK IMMEDIATE MEDICAL CARE IF:   You have pain that radiates from your back into your legs.  You develop new bowel or bladder control problems.  You have unusual weakness or numbness in your arms or legs.  You develop nausea or vomiting.  You develop abdominal pain.  You feel faint. Document Released: 03/17/2005 Document Revised: 09/16/2011 Document Reviewed: 08/05/2010 [employer] Patient Information 2013 [employer].  

## 2012-03-17 NOTE — Assessment & Plan Note (Signed)
Her BP is well controlled 

## 2012-03-17 NOTE — Progress Notes (Signed)
Subjective:    Patient ID: Barbara [name], female    DOB: [birth year], 51 y.o.   MRN: [id number]  Back Pain This is a chronic problem. The current episode started more than 1 year ago. The problem occurs intermittently. The problem is unchanged. The pain is present in the lumbar spine. The quality of the pain is described as aching. The pain does not radiate. The pain is at a severity of 6/10. The pain is moderate. The pain is worse during the day. The symptoms are aggravated by bending and position. Pertinent negatives include no abdominal pain, bladder incontinence, bowel incontinence, chest pain, dysuria, fever, headaches, leg pain, numbness, paresis, paresthesias, pelvic pain, perianal numbness, tingling, weakness or weight loss. Risk factors include obesity and lack of exercise. She has tried analgesics for the symptoms. The treatment provided moderate relief.      Review of Systems  Constitutional: Negative for fever, chills, weight loss, diaphoresis, activity change, appetite change, fatigue and unexpected weight change.  HENT: Negative.   Eyes: Negative.   Respiratory: Negative for cough, chest tightness, shortness of breath, wheezing and stridor.   Cardiovascular: Negative for chest pain.  Gastrointestinal: Negative for nausea, vomiting, abdominal pain, diarrhea, constipation and bowel incontinence.  Genitourinary: Negative.  Negative for bladder incontinence, dysuria and pelvic pain.  Musculoskeletal: Positive for back pain. Negative for myalgias, joint swelling, arthralgias and gait problem.  Skin: Negative for color change, pallor, rash and wound.  Neurological: Negative.  Negative for tingling, weakness, numbness, headaches and paresthesias.  Hematological: Negative for adenopathy. Does not bruise/bleed easily.  Psychiatric/Behavioral: Negative.        Objective:   Physical Exam  Vitals reviewed. Constitutional: She is oriented to person, place, and time. She appears  well-developed and well-nourished. No distress.  HENT:  Head: Normocephalic and atraumatic.  Mouth/Throat: Oropharynx is clear and moist. No oropharyngeal exudate.  Eyes: Conjunctivae normal are normal. Right eye exhibits no discharge. Left eye exhibits no discharge. No scleral icterus.  Neck: Normal range of motion. Neck supple. No JVD present. No tracheal deviation present. No thyromegaly present.  Cardiovascular: Normal rate, regular rhythm, normal heart sounds and intact distal pulses.  Exam reveals no gallop and no friction rub.   No murmur heard. Pulmonary/Chest: Effort normal and breath sounds normal. No stridor. No respiratory distress. She has no wheezes. She has no rales. She exhibits no tenderness.  Abdominal: Soft. Bowel sounds are normal. She exhibits no distension and no mass. There is no tenderness. There is no rebound and no guarding.  Musculoskeletal: Normal range of motion. She exhibits no edema and no tenderness.  Lymphadenopathy:    She has no cervical adenopathy.  Neurological: She is alert and oriented to person, place, and time. She has normal strength. She displays no atrophy, no tremor and normal reflexes. No cranial nerve deficit or sensory deficit. She exhibits normal muscle tone. She displays a negative Romberg sign. She displays no seizure activity. Coordination and gait normal. She displays no Babinski's sign on the right side. She displays no Babinski's sign on the left side.  Reflex Scores:      Tricep reflexes are 1+ on the right side and 1+ on the left side.      Bicep reflexes are 1+ on the right side and 1+ on the left side.      Brachioradialis reflexes are 1+ on the right side and 1+ on the left side.      Patellar reflexes are 1+ on the right side and  1+ on the left side.      Achilles reflexes are 1+ on the right side and 1+ on the left side.      - SLR in BLE  Skin: Skin is warm and dry. No rash noted. She is not diaphoretic. No erythema. No pallor.   Psychiatric: She has a normal mood and affect. Her behavior is normal. Judgment and thought content normal.      Lab Results  Component Value Date   WBC 8.3 12/23/2011   HGB 12.1 12/23/2011   HCT 36.8 12/23/2011   PLT 298 12/23/2011   GLUCOSE 95 12/23/2011   CHOL 191 09/23/2011   TRIG 127.0 09/23/2011   HDL 55.40 09/23/2011   LDLDIRECT 154.5 05/02/2011   LDLCALC 110* 09/23/2011   ALT 17 12/23/2011   AST 18 12/23/2011   NA 142 12/23/2011   K 4.0 12/23/2011   CL 102 12/23/2011   CREATININE 0.78 12/23/2011   BUN 17 12/23/2011   CO2 28 12/23/2011   TSH 0.79 05/02/2011      Assessment & Plan:

## 2012-04-02 ENCOUNTER — Telehealth: Payer: Self-pay | Admitting: Internal Medicine

## 2012-04-02 NOTE — Telephone Encounter (Signed)
Requesting samples of celebrex

## 2012-04-06 NOTE — Telephone Encounter (Signed)
Patient notified

## 2012-04-08 ENCOUNTER — Encounter: Payer: Self-pay | Admitting: Internal Medicine

## 2012-04-08 ENCOUNTER — Ambulatory Visit (INDEPENDENT_AMBULATORY_CARE_PROVIDER_SITE_OTHER): Payer: Medicare PPO | Admitting: Internal Medicine

## 2012-04-08 ENCOUNTER — Ambulatory Visit (INDEPENDENT_AMBULATORY_CARE_PROVIDER_SITE_OTHER)
Admission: RE | Admit: 2012-04-08 | Discharge: 2012-04-08 | Disposition: A | Discharge status: Home / Self Care | Payer: Medicare PPO | Source: Ambulatory Visit | Attending: Internal Medicine | Admitting: Internal Medicine

## 2012-04-08 VITALS — BP 122/84 | HR 84 | Temp 97.8°F | Resp 16 | Wt 134.0 lb

## 2012-04-08 DIAGNOSIS — R071 Chest pain on breathing: Secondary | ICD-10-CM

## 2012-04-08 DIAGNOSIS — S298XXA Other specified injuries of thorax, initial encounter: Secondary | ICD-10-CM

## 2012-04-08 DIAGNOSIS — R0789 Other chest pain: Secondary | ICD-10-CM

## 2012-04-08 NOTE — Patient Instructions (Signed)
Chest Wall Pain Chest wall pain is pain in or around the bones and muscles of your chest. It may take up to 6 weeks to get better. It may take longer if you must stay physically active in your work and activities.  CAUSES  Chest wall pain may happen on its own. However, it may be caused by:  A viral illness like the flu.  Injury.  Coughing.  Exercise.  Arthritis.  Fibromyalgia.  Shingles. HOME CARE INSTRUCTIONS   Avoid overtiring physical activity. Try not to strain or perform activities that cause pain. This includes any activities using your chest or your abdominal and side muscles, especially if heavy weights are used.  Put ice on the sore area.  Put ice in a plastic bag.  Place a towel between your skin and the bag.  Leave the ice on for 15 to 20 minutes per hour while awake for the first 2 days.  Only take over-the-counter or prescription medicines for pain, discomfort, or fever as directed by your caregiver. SEEK IMMEDIATE MEDICAL CARE IF:   Your pain increases, or you are very uncomfortable.  You have a fever.  Your chest pain becomes worse.  You have new, unexplained symptoms.  You have nausea or vomiting.  You feel sweaty or lightheaded.  You have a cough with phlegm (sputum), or you cough up blood. MAKE SURE YOU:   Understand these instructions.  Will watch your condition.  Will get help right away if you are not doing well or get worse. Document Released: 03/17/2005 Document Revised: 06/09/2011 Document Reviewed: 11/11/2010 [employer] Patient Information 2013 [employer].  

## 2012-04-08 NOTE — Assessment & Plan Note (Signed)
Findings are c/w a contusion She will continue her current meds

## 2012-04-08 NOTE — Progress Notes (Signed)
  Subjective:    Patient ID: Barbara [name], female    DOB: [birth year], 52 y.o.   MRN: [id number]  Chest Pain  This is a new problem. Episode onset: 2 weeks ago. The onset quality is sudden (she fell and landed on her right anterior chest wall). The problem occurs intermittently. The problem has been unchanged. The pain is present in the lateral region. The pain is at a severity of 2/10. The pain is mild. The quality of the pain is described as stabbing. The pain does not radiate. Pertinent negatives include no abdominal pain, back pain, claudication, cough, diaphoresis, dizziness, exertional chest pressure, fever, headaches, hemoptysis, irregular heartbeat, leg pain, lower extremity edema, malaise/fatigue, nausea, near-syncope, numbness, orthopnea, palpitations, PND, shortness of breath, sputum production, syncope, vomiting or weakness. The pain is aggravated by breathing, coughing and movement. She has tried NSAIDs, acetaminophen and analgesics for the symptoms. The treatment provided moderate relief.      Review of Systems  Constitutional: Negative.  Negative for fever, malaise/fatigue and diaphoresis.  HENT: Negative.   Eyes: Negative.   Respiratory: Negative for apnea, cough, hemoptysis, sputum production, choking, chest tightness, shortness of breath and stridor.   Cardiovascular: Positive for chest pain. Negative for palpitations, orthopnea, claudication, leg swelling, syncope, PND and near-syncope.  Gastrointestinal: Negative.  Negative for nausea, vomiting and abdominal pain.  Genitourinary: Negative.   Musculoskeletal: Negative.  Negative for back pain.  Skin: Negative.   Neurological: Negative.  Negative for dizziness, speech difficulty, weakness, numbness and headaches.  Hematological: Negative for adenopathy. Does not bruise/bleed easily.  Psychiatric/Behavioral: Negative.        Objective:   Physical Exam  Vitals reviewed. Constitutional: She is oriented to person, place, and  time. She appears well-developed and well-nourished.  Non-toxic appearance. She does not have a sickly appearance. She does not appear ill. No distress.  HENT:  Head: Normocephalic and atraumatic.  Nose: Nose normal.  Mouth/Throat: Oropharynx is clear and moist.  Eyes: Conjunctivae normal are normal. Right eye exhibits no discharge. Left eye exhibits no discharge. No scleral icterus.  Neck: Normal range of motion. Neck supple. No JVD present. No tracheal deviation present. No thyromegaly present.  Cardiovascular: Normal rate, regular rhythm, normal heart sounds and intact distal pulses.  Exam reveals no gallop.   No murmur heard. Pulmonary/Chest: Effort normal and breath sounds normal. No accessory muscle usage or stridor. Not tachypneic. No respiratory distress. She has no decreased breath sounds. She has no wheezes. She has no rhonchi. She has no rales. Chest wall is not dull to percussion. She exhibits tenderness and bony tenderness. She exhibits no mass, no laceration, no crepitus, no edema, no deformity, no swelling and no retraction.    Abdominal: Soft. Bowel sounds are normal. She exhibits no distension and no mass. There is no tenderness. There is no rebound and no guarding.  Musculoskeletal: Normal range of motion. She exhibits no edema and no tenderness.  Lymphadenopathy:    She has no cervical adenopathy.  Neurological: She is oriented to person, place, and time.  Skin: Skin is warm and dry. No rash noted. She is not diaphoretic. No erythema. No pallor.  Psychiatric: She has a normal mood and affect. Her behavior is normal. Judgment and thought content normal.          Assessment & Plan:

## 2012-04-08 NOTE — Assessment & Plan Note (Signed)
Chest xray is normal

## 2012-04-12 ENCOUNTER — Telehealth: Payer: Self-pay | Admitting: Internal Medicine

## 2012-04-12 NOTE — Telephone Encounter (Signed)
Caller: Barbara [name]/Patient; Phone: [phone]; Reason for Call: Pt is calling to see if Dr Yetta Barre can set her up for an MRI as soon as possible.  Pt was seen in the office on 03/17/12 for the back pain (old injury).  Pt was managing pain ok she states, but this am she was woken up at 0400 in pain, and now she would like to pursue testing as discussed with pt and Dr Yetta Barre.  OFFICE PLEASE FOLLOW UP WITH PATIENT IF MRI CAN BE SCHEDULED AND WHEN IT WILL BE SCHEDULED FOR

## 2012-04-13 NOTE — Telephone Encounter (Signed)
She was seen for chest pain We did not discuss an MRI so I don't know what she is referring to

## 2012-04-17 ENCOUNTER — Encounter: Payer: Self-pay | Admitting: Internal Medicine

## 2012-04-22 ENCOUNTER — Other Ambulatory Visit: Payer: Self-pay | Admitting: Internal Medicine

## 2012-04-22 DIAGNOSIS — M5137 Other intervertebral disc degeneration, lumbosacral region: Secondary | ICD-10-CM

## 2012-04-22 DIAGNOSIS — M549 Dorsalgia, unspecified: Secondary | ICD-10-CM

## 2012-04-22 DIAGNOSIS — M5416 Radiculopathy, lumbar region: Secondary | ICD-10-CM

## 2012-04-29 ENCOUNTER — Ambulatory Visit (INDEPENDENT_AMBULATORY_CARE_PROVIDER_SITE_OTHER): Payer: Medicare PPO | Admitting: Internal Medicine

## 2012-04-29 ENCOUNTER — Encounter: Payer: Self-pay | Admitting: Internal Medicine

## 2012-04-29 ENCOUNTER — Ambulatory Visit
Admission: RE | Admit: 2012-04-29 | Discharge: 2012-04-29 | Disposition: A | Discharge status: Home / Self Care | Payer: Medicare PPO | Source: Ambulatory Visit | Attending: Internal Medicine | Admitting: Internal Medicine

## 2012-04-29 ENCOUNTER — Other Ambulatory Visit (INDEPENDENT_AMBULATORY_CARE_PROVIDER_SITE_OTHER): Payer: Medicare PPO

## 2012-04-29 ENCOUNTER — Ambulatory Visit (INDEPENDENT_AMBULATORY_CARE_PROVIDER_SITE_OTHER)
Admission: RE | Admit: 2012-04-29 | Discharge: 2012-04-29 | Disposition: A | Discharge status: Home / Self Care | Payer: Medicare PPO | Source: Ambulatory Visit | Attending: Internal Medicine | Admitting: Internal Medicine

## 2012-04-29 VITALS — BP 130/78 | HR 91 | Temp 98.2°F | Resp 16 | Wt 134.0 lb

## 2012-04-29 DIAGNOSIS — IMO0001 Reserved for inherently not codable concepts without codable children: Secondary | ICD-10-CM | POA: Insufficient documentation

## 2012-04-29 DIAGNOSIS — M48061 Spinal stenosis, lumbar region without neurogenic claudication: Secondary | ICD-10-CM | POA: Insufficient documentation

## 2012-04-29 DIAGNOSIS — R0989 Other specified symptoms and signs involving the circulatory and respiratory systems: Secondary | ICD-10-CM

## 2012-04-29 DIAGNOSIS — M549 Dorsalgia, unspecified: Secondary | ICD-10-CM

## 2012-04-29 DIAGNOSIS — IMO0002 Reserved for concepts with insufficient information to code with codable children: Secondary | ICD-10-CM

## 2012-04-29 DIAGNOSIS — R0600 Dyspnea, unspecified: Secondary | ICD-10-CM | POA: Insufficient documentation

## 2012-04-29 DIAGNOSIS — I10 Essential (primary) hypertension: Secondary | ICD-10-CM

## 2012-04-29 DIAGNOSIS — R0609 Other forms of dyspnea: Secondary | ICD-10-CM

## 2012-04-29 DIAGNOSIS — M5416 Radiculopathy, lumbar region: Secondary | ICD-10-CM

## 2012-04-29 DIAGNOSIS — M5137 Other intervertebral disc degeneration, lumbosacral region: Secondary | ICD-10-CM

## 2012-04-29 LAB — CBC WITH DIFFERENTIAL/PLATELET
Basophils Relative: 0.1 % (ref 0.0–3.0)
Eosinophils Relative: 2.5 % (ref 0.0–5.0)
HCT: 38.5 % (ref 36.0–46.0)
Hemoglobin: 13 g/dL (ref 12.0–15.0)
Lymphs Abs: 2.5 10*3/uL (ref 0.7–4.0)
MCV: 89.9 fl (ref 78.0–100.0)
Monocytes Absolute: 0.3 10*3/uL (ref 0.1–1.0)
RBC: 4.28 Mil/uL (ref 3.87–5.11)
WBC: 7.4 10*3/uL (ref 4.5–10.5)

## 2012-04-29 LAB — SEDIMENTATION RATE: Sed Rate: 22 mm/hr (ref 0–22)

## 2012-04-29 NOTE — Assessment & Plan Note (Signed)
Her BP is well controlled 

## 2012-04-29 NOTE — Progress Notes (Signed)
  Subjective:    Patient ID: Barbara [name], female    DOB: 1960-12-22, 52 y.o.   MRN: [id number]  Shortness of Breath This is a new problem. The current episode started yesterday. The problem occurs intermittently. The problem has been gradually improving. The average episode lasts 5 minutes. Associated symptoms include chest pain (brief episode of "pressure and butterflies" across her chest). Pertinent negatives include no abdominal pain, claudication, coryza, ear pain, fever, headaches, hemoptysis, leg pain, leg swelling, neck pain, orthopnea, PND, rash, rhinorrhea, sore throat, sputum production, swollen glands, syncope, vomiting or wheezing. Nothing aggravates the symptoms. She has tried nothing for the symptoms.      Review of Systems  Constitutional: Negative for fever, chills, diaphoresis, activity change, appetite change, fatigue and unexpected weight change.  HENT: Negative.  Negative for ear pain, sore throat, rhinorrhea and neck pain.   Eyes: Negative.   Respiratory: Positive for cough (nonproductive) and shortness of breath. Negative for apnea, hemoptysis, sputum production, choking, chest tightness, wheezing and stridor.   Cardiovascular: Positive for chest pain (brief episode of "pressure and butterflies" across her chest). Negative for palpitations, orthopnea, claudication, leg swelling, syncope and PND.  Gastrointestinal: Negative for nausea, vomiting, abdominal pain, diarrhea, constipation and blood in stool.  Genitourinary: Negative.   Musculoskeletal: Positive for myalgias and arthralgias. Negative for back pain, joint swelling and gait problem.  Skin: Negative for color change, pallor, rash and wound.  Neurological: Negative for dizziness, syncope, weakness, light-headedness, numbness and headaches.  Hematological: Negative for adenopathy. Does not bruise/bleed easily.  Psychiatric/Behavioral: Negative.        Objective:   Physical Exam  Vitals reviewed. Constitutional:  She is oriented to person, place, and time. She appears well-developed and well-nourished.  Non-toxic appearance. She does not have a sickly appearance. She does not appear ill. No distress.  HENT:  Head: Normocephalic and atraumatic.  Mouth/Throat: Oropharynx is clear and moist. No oropharyngeal exudate.  Eyes: Conjunctivae normal are normal. Right eye exhibits no discharge. Left eye exhibits no discharge. No scleral icterus.  Neck: Normal range of motion. Neck supple. No JVD present. No tracheal deviation present. No thyromegaly present.  Cardiovascular: Normal rate, regular rhythm, normal heart sounds and intact distal pulses.  Exam reveals no gallop and no friction rub.   No murmur heard. Pulmonary/Chest: Effort normal and breath sounds normal. No stridor. No respiratory distress. She has no wheezes. She has no rales. She exhibits no tenderness.  Abdominal: Soft. Bowel sounds are normal. She exhibits no distension and no mass. There is no tenderness. There is no rebound and no guarding.  Musculoskeletal: Normal range of motion. She exhibits no edema and no tenderness.  Lymphadenopathy:    She has no cervical adenopathy.  Neurological: She is oriented to person, place, and time.  Skin: Skin is warm and dry. No rash noted. She is not diaphoretic. No erythema. No pallor.  Psychiatric: She has a normal mood and affect. Her behavior is normal. Judgment and thought content normal.          Assessment & Plan:

## 2012-04-29 NOTE — Assessment & Plan Note (Signed)
I will check her CPK and ESR to see if she has a myopathy or inflammatory condition.

## 2012-04-29 NOTE — Assessment & Plan Note (Signed)
EKG and CXR are normal. I will check her labs to look for edema, ischemia, anemia. This does not sound like it is significant, my hunch is that this is anxiety

## 2012-04-29 NOTE — Patient Instructions (Signed)

## 2012-04-29 NOTE — Addendum Note (Signed)
Addended by: Etta Grandchild on: 04/29/2012 02:03 PM   Modules accepted: Orders

## 2012-05-10 ENCOUNTER — Other Ambulatory Visit: Payer: Self-pay | Admitting: Internal Medicine

## 2012-05-29 ENCOUNTER — Emergency Department (HOSPITAL_COMMUNITY)
Admission: EM | Admit: 2012-05-29 | Discharge: 2012-05-30 | Disposition: A | Discharge status: Home / Self Care | Payer: Medicare PPO | Attending: Emergency Medicine | Admitting: Emergency Medicine

## 2012-05-29 DIAGNOSIS — Z8541 Personal history of malignant neoplasm of cervix uteri: Secondary | ICD-10-CM | POA: Insufficient documentation

## 2012-05-29 DIAGNOSIS — M545 Low back pain, unspecified: Secondary | ICD-10-CM | POA: Insufficient documentation

## 2012-05-29 DIAGNOSIS — Z8719 Personal history of other diseases of the digestive system: Secondary | ICD-10-CM | POA: Insufficient documentation

## 2012-05-29 DIAGNOSIS — F3289 Other specified depressive episodes: Secondary | ICD-10-CM | POA: Insufficient documentation

## 2012-05-29 DIAGNOSIS — Z79899 Other long term (current) drug therapy: Secondary | ICD-10-CM | POA: Insufficient documentation

## 2012-05-29 DIAGNOSIS — I10 Essential (primary) hypertension: Secondary | ICD-10-CM | POA: Insufficient documentation

## 2012-05-29 DIAGNOSIS — Z87891 Personal history of nicotine dependence: Secondary | ICD-10-CM | POA: Insufficient documentation

## 2012-05-29 DIAGNOSIS — H409 Unspecified glaucoma: Secondary | ICD-10-CM | POA: Insufficient documentation

## 2012-05-29 DIAGNOSIS — E785 Hyperlipidemia, unspecified: Secondary | ICD-10-CM | POA: Insufficient documentation

## 2012-05-29 DIAGNOSIS — F411 Generalized anxiety disorder: Secondary | ICD-10-CM | POA: Insufficient documentation

## 2012-05-29 DIAGNOSIS — M5090 Cervical disc disorder, unspecified, unspecified cervical region: Secondary | ICD-10-CM | POA: Insufficient documentation

## 2012-05-29 DIAGNOSIS — IMO0001 Reserved for inherently not codable concepts without codable children: Secondary | ICD-10-CM | POA: Insufficient documentation

## 2012-05-29 NOTE — ED Notes (Signed)
ems- left leg pain, start about 4 hours ago. Was sitting and sat up and pain became severe, hx spinal stenosis, pms x4, a x o times 4- vs: stable conscious and alert.

## 2012-05-30 ENCOUNTER — Encounter: Payer: Self-pay | Admitting: Internal Medicine

## 2012-05-30 MED ORDER — SODIUM CHLORIDE 0.9 % IV SOLN
Freq: Once | INTRAVENOUS | Status: AC
  Administered 2012-05-30: 500 mL via INTRAVENOUS

## 2012-05-30 MED ORDER — HYDROMORPHONE HCL PF 1 MG/ML IJ SOLN
1.0000 mg | Freq: Once | INTRAMUSCULAR | Status: AC
  Administered 2012-05-30: 1 mg via INTRAVENOUS
  Filled 2012-05-30: qty 1

## 2012-05-30 MED ORDER — ONDANSETRON HCL 4 MG/2ML IJ SOLN
4.0000 mg | Freq: Once | INTRAMUSCULAR | Status: AC
  Administered 2012-05-30: 4 mg via INTRAVENOUS
  Filled 2012-05-30: qty 2

## 2012-05-30 MED ORDER — FENTANYL CITRATE 0.05 MG/ML IJ SOLN
50.0000 ug | Freq: Once | INTRAMUSCULAR | Status: AC
  Administered 2012-05-30: 50 ug via INTRAVENOUS
  Filled 2012-05-30: qty 2

## 2012-05-30 NOTE — ED Provider Notes (Signed)
History     CSN: [id number]  Arrival date & time 05/29/12  2339   First MD Initiated Contact with Patient 05/30/12 0047      Chief Complaint  Patient presents with  . Back Pain    left   HPI  History provided by the patient and husband. Patient is a 52 year old female with history of hypertension, hyperlipidemia, fibromyalgia, IBS and degenerative disc disease who presents with complaints of worsened low back pain and pain radiating down left lower extremity. Patient reports having recent workup for low back pains and left lower extremity pain. She had MRI on January 30 showing herniated discs. Patient reports having followup planned with a neurosurgery specialist this coming week. Last night she began having severe increase in pain to her lower back and having new pain is down the left lower extremity. Pain is worse with any movements or sitting. Patient took prescribed pain medications and muscle relaxers without improvement. She denies any other changes in symptoms. She has not had any recent urinary or fecal incontinence. She denies urinary retention or perineal numbness. She denies any fever, chills or sweats. Denies any weight loss.    Past Medical History  Diagnosis Date  . Hyperlipidemia   . HTN (hypertension)   . Cervical cancer   . Fibromyalgia   . Glaucoma(365)   . Anxiety   . IBS (irritable bowel syndrome)   . Internal and external hemorrhoids without complication   . Depression   . Cervical disc disease     Past Surgical History  Procedure Laterality Date  . Cholecystectomy    . Inguinal hernia repair    . Abdominal hysterectomy    . Oophorectomy    . Colonoscopy    . Breast lumpectomy      Family History  Problem Relation Age of Onset  . Adopted: Yes    History  Substance Use Topics  . Smoking status: Former Smoker -- 1.00 packs/day for 30 years    Types: Cigarettes    Quit date: 08/07/2011  . Smokeless tobacco: Never Used  . Alcohol Use: No   Comment: 1-2 glasses of wine friday night    OB History   Grav Para Term Preterm Abortions TAB SAB Ect Mult Living                  Review of Systems  Constitutional: Negative for fever, chills and unexpected weight change.  Respiratory: Negative for shortness of breath.   Cardiovascular: Negative for chest pain and leg swelling.  Genitourinary: Negative for dysuria, frequency, hematuria and flank pain.  Musculoskeletal: Positive for back pain.  Neurological: Positive for weakness. Negative for numbness.  All other systems reviewed and are negative.    Allergies  Erythromycin; Penicillins; and Codeine  Home Medications   Current Outpatient Rx  Name  Route  Sig  Dispense  Refill  . ALPRAZolam (XANAX) 0.5 MG tablet      TAKE ONE TABLET BY MOUTH THREE TIMES DAILY AS NEEDED   90 tablet   0   . celecoxib (CELEBREX) 200 MG capsule   Oral   Take 1 capsule (200 mg total) by mouth daily.   12 capsule   0   . HYDROcodone-acetaminophen (NORCO) 7.5-325 MG per tablet      TAKE ONE TABLET BY MOUTH EVERY 6 HOURS AS NEEDED FOR PAIN   100 tablet   0   . lisinopril (PRINIVIL,ZESTRIL) 20 MG tablet   Oral   Take 20 mg by  mouth daily.         . Multiple Vitamins-Minerals (ALIVE WOMENS 50+ PO)   Oral   Take 1 tablet by mouth daily.         . temazepam (RESTORIL) 30 MG capsule   Oral   Take 30 mg by mouth at bedtime as needed. For insomnia           BP 128/64  Pulse 97  Temp(Src) 98.1 F (36.7 C) (Oral)  Resp 18  SpO2 100%  Physical Exam  Nursing note and vitals reviewed. Constitutional: She is oriented to person, place, and time. She appears well-developed and well-nourished. No distress.  HENT:  Head: Normocephalic.  Cardiovascular: Normal rate and regular rhythm.   Pulmonary/Chest: Effort normal and breath sounds normal.  Abdominal: Soft.  Genitourinary:  Chaperone was present. Patient has normal rectal tone and sensations in perineal area.    Musculoskeletal:       Lumbar back: She exhibits tenderness.       Back:  Neurological: She is alert and oriented to person, place, and time.  Skin: Skin is warm and dry. No rash noted.  Psychiatric: She has a normal mood and affect. Her behavior is normal.    ED Course  Procedures       1. Back pain       MDM  1:10AM patient seen and evaluated. Patient appears uncomfortable but in no acute distress. Vital signs reviewed and unremarkable.  Patient now having improvements of pain after medications. She is able to move much more comfortably. She has normal rectal tone and no new concerning or red flag symptoms regarding her chronic back pain issues. This time patient is stable to be discharged home. She has followup planned with her doctor on Tuesday already. She also has plans she neurosurgery specialist in the near future.      Angus Seller, [place] 05/30/12 [phone]

## 2012-05-30 NOTE — ED Provider Notes (Signed)
Medical screening examination/treatment/procedure(s) were performed by non-physician practitioner and as supervising physician I was immediately available for consultation/collaboration. Iva Knapp, MD, FACEP   Iva L Knapp, MD 05/30/12 0426 

## 2012-05-31 ENCOUNTER — Telehealth: Payer: Self-pay | Admitting: Internal Medicine

## 2012-05-31 NOTE — Telephone Encounter (Signed)
[facility] Triage Call Report Triage Record Num: [id number] Operator: Candida Peeling Patient Name: Suzanne Kho Call Date & Time: 05/29/2012 10:17:39PM Patient Phone: [phone] PCP: Sanda Linger Patient Gender: Female PCP Fax : Patient DOB: 15-Aug-1960 [facility] Name[facility] Reason for Call: Caller: Jannett/Patient; PCP: Sanda Linger (Adults only); CB#: [phone]; Call regarding Pain in Left Leg; Onset 05/29/12 has been having pain in left leg for approx 2 months, but has been intermittent but having worse left leg pain from groin down front of leg down to knee, unable to lift leg up to make a step due to pain, has taken all the Vicodin allowed in 24 hour period. Afebrile. Has an appt w/ neurosurgeon. Caller states pain is so severe she is unable to stand. Emergent sxs of "Profound weakness or inability to stand" per Muscle Pain protocol. RN advised call 911. Protocol(s) Used: Leg Non-Injury Protocol(s) Used: Muscle Pain Recommended Outcome per Protocol: Activate EMS 911 Reason for Outcome: Generalized muscle pain or aching Profound weakness or inability to stand Care Advice: ~ Do not give the patient anything to eat or drink. Write down provider's name. List or place the following in a bag for transport with the patient: current prescription and/or nonprescription medications; alternative treatments, therapies and medications; and street drugs.

## 2012-05-31 NOTE — Telephone Encounter (Signed)
Patient Information:  Caller Name: K[place]ah  Phone: [phone]  Patient: Journi, Moffa  Gender: Female  DOB: 1960/06/30  Age: 52 Years  PCP: Sanda Linger (Adults only)  Pregnant: No  Office Follow Up:  Does the office need to follow up with this patient?: No  Instructions For The Office: N/A  RN Note:  Patient has appt tomorrow 06/01/12 at 09:00 am (scheduled by office staff). Encouraged to keep appt. .  Home care advice and call back parameters reviewed.  Understanding expressed.  Symptoms  Reason For Call & Symptoms: Patient seen in [facility] on Saturday ,05/29/12.  Pain in left leg from groin down the front the leg  to foot.  Unable to bear weight.  ER diagnosed referred from spinal stenosis.  Leg is not swollen or bruised.   Mild back pain. Fall last Monday  05/24/12 but never had discomfort until Saturday 05/29/12.  Hx of stenosis/two buldging lumbar disc from MRI January 2014.  Reviewed Health History In EMR: Yes  Reviewed Medications In EMR: Yes  Reviewed Allergies In EMR: Yes  Reviewed Surgeries / Procedures: No  Date of Onset of Symptoms: 05/29/2012  Treatments Tried: Seen in ER 05/29/12.  vicodan 7.5/325mg  . xanax, heat , warm baths  Treatments Tried Worked: No OB / GYN:  LMP: Unknown  Guideline(s) Used:  Leg Pain  Disposition Per Guideline:   See Today or Tomorrow in Office  Reason For Disposition Reached:   Patient wants to be seen  Advice Given:  Call Back If:  Moderate pain (e.g., limping) lasts more than 3 days  Mild pain lasts more than 7 days  Signs of infection occur (e.g., spreading redness, warmth, fever)  You become worse.  Apply Heat to the Area:   Beginning 48 hours after an injury, apply a warm washcloth or heating pad for 10 minutes 3 times a day.  This will help increase blood flow and improve healing.  Local Heat  (Bathtub option): If stiffness lasts more than 48 hours, relax in a hot bath for 20 minutes twice a day and gently exercise the  involved part under water.  Rest:   You should try to avoid any exercise or activity that caused this pain for the next 3 days.

## 2012-06-01 ENCOUNTER — Ambulatory Visit (INDEPENDENT_AMBULATORY_CARE_PROVIDER_SITE_OTHER)
Admission: RE | Admit: 2012-06-01 | Discharge: 2012-06-01 | Disposition: A | Discharge status: Home / Self Care | Payer: Medicare PPO | Source: Ambulatory Visit | Attending: Internal Medicine | Admitting: Internal Medicine

## 2012-06-01 ENCOUNTER — Telehealth: Payer: Self-pay | Admitting: Internal Medicine

## 2012-06-01 ENCOUNTER — Encounter: Payer: Self-pay | Admitting: Internal Medicine

## 2012-06-01 ENCOUNTER — Ambulatory Visit: Payer: Medicare PPO | Admitting: Internal Medicine

## 2012-06-01 ENCOUNTER — Ambulatory Visit (INDEPENDENT_AMBULATORY_CARE_PROVIDER_SITE_OTHER): Payer: Medicare PPO | Admitting: Internal Medicine

## 2012-06-01 VITALS — BP 132/90 | HR 75 | Temp 98.0°F | Wt 134.8 lb

## 2012-06-01 DIAGNOSIS — M48061 Spinal stenosis, lumbar region without neurogenic claudication: Secondary | ICD-10-CM

## 2012-06-01 DIAGNOSIS — M5137 Other intervertebral disc degeneration, lumbosacral region: Secondary | ICD-10-CM

## 2012-06-01 DIAGNOSIS — M25559 Pain in unspecified hip: Secondary | ICD-10-CM

## 2012-06-01 DIAGNOSIS — IMO0002 Reserved for concepts with insufficient information to code with codable children: Secondary | ICD-10-CM

## 2012-06-01 MED ORDER — PREDNISONE (PAK) 10 MG PO TABS: 10.0000 mg | ORAL_TABLET | ORAL | Status: DC

## 2012-06-01 MED ORDER — TIZANIDINE HCL 4 MG PO CAPS: 4.0000 mg | ORAL_CAPSULE | Freq: Three times a day (TID) | ORAL | Status: DC | PRN

## 2012-06-01 NOTE — Progress Notes (Signed)
Subjective:    Patient ID: Barbara [name], female    DOB: 12/29/1960, 52 y.o.   MRN: [id number]  Back Pain This is a chronic problem. The current episode started in the past 7 days. The problem occurs intermittently. The problem is unchanged. The pain is present in the lumbar spine and gluteal. The quality of the pain is described as burning and shooting. The pain radiates to the left foot, left knee and left thigh. The pain is severe. The pain is the same all the time. The symptoms are aggravated by position and standing. Associated symptoms include abdominal pain, leg pain, numbness (left anterior thigh), tingling and weakness (left leg ). Pertinent negatives include no bladder incontinence, bowel incontinence, chest pain, dysuria or fever. She has tried analgesics and bed rest for the symptoms. The treatment provided no relief.  She is currently taking 4 hydrocodone 7.5mg  daily and 2 Xanex daily to "boost the pain medication" and is still experiencing pain.  MRI completed in January. Patient was referred to neurosurgery specialist in January but is still awaiting an appointment.   Past Medical History  Diagnosis Date  . Hyperlipidemia   . HTN (hypertension)   . Cervical cancer   . Fibromyalgia   . Glaucoma(365)   . Anxiety   . IBS (irritable bowel syndrome)   . Internal and external hemorrhoids without complication   . Depression   . Cervical disc disease      Review of Systems  Constitutional: Positive for appetite change (decreased appetite in the past few days). Negative for fever.  Respiratory: Negative for chest tightness and shortness of breath.   Cardiovascular: Positive for palpitations. Negative for chest pain.  Gastrointestinal: Positive for nausea, abdominal pain and constipation. Negative for vomiting, diarrhea and bowel incontinence.  Genitourinary: Positive for difficulty urinating (urinary hesitancy ). Negative for bladder incontinence and dysuria.  Musculoskeletal:  Positive for back pain and gait problem (due to pain).  Neurological: Positive for tingling, weakness (left leg ) and numbness (left anterior thigh).  Psychiatric/Behavioral: The patient is nervous/anxious.        Objective:   Physical Exam  Vitals reviewed. Constitutional: She is oriented to person, place, and time. She appears well-developed and well-nourished. She appears distressed (due to back pain ).  HENT:  Head: Normocephalic and atraumatic.  Eyes: Conjunctivae are normal. Pupils are equal, round, and reactive to light. Right eye exhibits no discharge. Left eye exhibits no discharge. No scleral icterus.  Cardiovascular: Normal rate, regular rhythm and normal heart sounds.  Exam reveals no gallop and no friction rub.   No murmur heard. Pulmonary/Chest: Effort normal and breath sounds normal. No respiratory distress. She has no wheezes.  Abdominal: Soft. She exhibits no mass. There is no tenderness. There is no guarding.  Musculoskeletal:       Left hip: She exhibits decreased range of motion, decreased strength (3/5 limited by pain) and tenderness.       Lumbar back: She exhibits tenderness and pain.  Neurological: She is alert and oriented to person, place, and time. She has normal reflexes. No cranial nerve deficit.  Psychiatric: She has a normal mood and affect. Her behavior is normal. Judgment and thought content normal.  Slightly anxious.    Filed Vitals:   06/01/12 1123  BP: 132/90  Pulse: 75  Temp: 98 F (36.7 C)   Lab Results  Component Value Date   WBC 7.4 04/29/2012   HGB 13.0 04/29/2012   HCT 38.5 04/29/2012   PLT  298.0 04/29/2012   GLUCOSE 95 12/23/2011   CHOL 191 09/23/2011   TRIG 127.0 09/23/2011   HDL 55.40 09/23/2011   LDLDIRECT 154.5 05/02/2011   LDLCALC 110* 09/23/2011   ALT 17 12/23/2011   AST 18 12/23/2011   NA 142 12/23/2011   K 4.0 12/23/2011   CL 102 12/23/2011   CREATININE 0.78 12/23/2011   BUN 17 12/23/2011   CO2 28 12/23/2011   TSH 0.79 05/02/2011         Assessment & Plan:  Back pain- Plain radiograph of the left hip to look for acute hip injury causing anterior thigh pain. 12 day Pred-Pak to calm inflammation. Muscle relaxers rx'd to help alleviate pain. Patient encouraged to make an appointment with Neruosurg. Will make new referral to Ortho Spine Specialist. Follow up with Dr. Yetta Barre as needed.   Perline Awe PA-S  I have personally reviewed this case with PA student. I also personally examined this patient. I agree with history and findings as documented above. I reviewed, discussed and approve of the assessment and plan as listed above. Rene Paci, MD

## 2012-06-01 NOTE — Progress Notes (Signed)
Subjective:    Patient ID: Barbara [name], female    DOB: Oct 31, 1960, 52 y.o.   MRN: [id number]  HPI  Complains of upper left leg pain ER visit for same reviewed Describes pain 9/10 and severe, constantly present Denies new injury, fall or precipitating problem in the context of chronic low back pain with radiculopathy symptoms MRI 04/30/2012 reviewed, pending referral to neurosurgery but no appointment yet scheduled Describes pain as burning, shooting and throbbing Denies falls or weakness Pain suboptimally controlled on current narcotic and benzodiazepine regimen Pain exacerbated by any movement including sitting, standing, walking;  Pain is briefly relieved at rest or lying down  Past Medical History  Diagnosis Date  . Hyperlipidemia   . HTN (hypertension)   . Cervical cancer   . Fibromyalgia   . Glaucoma(365)   . Anxiety   . IBS (irritable bowel syndrome)   . Internal and external hemorrhoids without complication   . Depression   . Cervical disc disease     Review of Systems  Constitutional: Negative for fever and unexpected weight change.  Respiratory: Negative for cough and shortness of breath.   Cardiovascular: Negative for chest pain and palpitations.  Musculoskeletal: Positive for back pain and gait problem. Negative for joint swelling.  Skin: Negative for rash and wound.  Neurological: Negative for facial asymmetry, weakness and numbness.  Psychiatric/Behavioral: Positive for sleep disturbance. Negative for confusion. The patient is nervous/anxious.        Objective:   Physical Exam BP 132/90  Pulse 75  Temp(Src) 98 F (36.7 C) (Oral)  Wt 134 lb 12.8 oz (61.145 kg)  BMI 27.21 kg/m2  SpO2 97% Wt Readings from Last 3 Encounters:  06/01/12 134 lb 12.8 oz (61.145 kg)  04/29/12 134 lb (60.782 kg)  04/08/12 134 lb (60.782 kg)   Constitutional: She appears well-developed and well-nourished. uncomfortable and anxious but no acute distress Cardiovascular:  Normal rate, regular rhythm and normal heart sounds.  No murmur heard. No BLE edema. Pulmonary/Chest: Effort normal and breath sounds normal. No respiratory distress. She has no wheezes.  Abdominal: Soft. Bowel sounds are normal. She exhibits no distension. There is no tenderness. no masses Musculoskeletal: tender to palpation over left groin, normal range of motion left hip with pain on external rotation - Back: full range of motion of thoracic and lumbar spine. Non tender to palpation. Positive ipsilateral straight leg raise. DTR's are symmetrically intact. Sensation intact in all dermatomes of the lower extremities. Full strength to manual muscle testing. patient is able to heel toe walk with min difficulty and ambulates with antalgic gait. Skin: Skin is warm and dry. No rash noted. No erythema.  Psychiatric: She has an anxious and dysphoric mood/affect. Her behavior is normal. Judgment and thought content normal.   Lab Results  Component Value Date   WBC 7.4 04/29/2012   HGB 13.0 04/29/2012   HCT 38.5 04/29/2012   PLT 298.0 04/29/2012   GLUCOSE 95 12/23/2011   CHOL 191 09/23/2011   TRIG 127.0 09/23/2011   HDL 55.40 09/23/2011   LDLDIRECT 154.5 05/02/2011   LDLCALC 110* 09/23/2011   ALT 17 12/23/2011   AST 18 12/23/2011   NA 142 12/23/2011   K 4.0 12/23/2011   CL 102 12/23/2011   CREATININE 0.78 12/23/2011   BUN 17 12/23/2011   CO2 28 12/23/2011   TSH 0.79 05/02/2011   Dg Hip Complete Left  06/01/2012  *RADIOLOGY REPORT*  Clinical Data: The left scarring pain for 2 weeks.  The fall  1 week ago.  LEFT HIP - COMPLETE 2+ VIEW  Comparison: None.  Findings: The bony pelvis, sacrum and sacroiliac joints appear normal.  The hip joints are symmetrical.  There are no soft tissue abnormalities.  Frontal and lateral views of the left hip and proximal left femur show no acute bone, joint or soft tissue abnormalities  IMPRESSION: No acute findings.  No evidence of acute trauma.   Original Report Authenticated By:  Sander Radon, M.D.       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

## 2012-06-01 NOTE — Telephone Encounter (Signed)
[facility] Triage Call Report Triage Record Num: [id number] Operator: Tomasita Crumble Patient Name: Barbara [name] Call Date & Time: 05/31/2012 5:16:43PM Patient Phone: [phone] PCP: Sanda Linger Patient Gender: Female PCP Fax : Patient DOB: Aug 11, 1960 [facility] Reason for Call: Caller: [name]/Patient; PCP: Sanda Linger (Adults only); CB#: [phone]; Call regarding Follow Up and See What I M Supposed To Do.; Seen in ED 05/29/12 and sent home after several hours. The reason she was seen was leg pain; she went to ED via 911 and told to follow up with office. Appointment scheduled for 06/01/12 was cancelled due to inclement weather. Pain is the same. Groin pain reported which runs down the front of thigh to knee. States symptoms have not changed since she was last evaluated. Advised follow up with office on 06/01/12 when they reopen at 10:00 (closed early 05/31/12 due to inclement weather and opening late on 06/01/12 due to same). Caller declined offer of appointment for 06/01/12. Protocol(s) Used: No Guideline - Advice Per Reference (Adult) Recommended Outcome per Protocol: Call Provider within 24 Hours Reason for Outcome: CALL PROVIDER WITHIN 24 HOURS

## 2012-06-01 NOTE — Telephone Encounter (Signed)
Pt called stating that Zanaflex is too expensive. Pt is requesting cheaper alternative.

## 2012-06-01 NOTE — Patient Instructions (Signed)
It was good to see you today. We have reviewed your prior records including labs and tests today Test(s) ordered today. Your results will be released to MyChart (or called to you) after review, usually within 72hours after test completion. If any changes need to be made, you will be notified at that same time. For pain, take prednisone taper over the next 12 days and use Zanaflex muscle relaxer or times daily as needed for spasm -Your prescription(s) have been submitted to your pharmacy. Please take as directed and contact our office if you believe you are having problem(s) with the medication(s). Other medications reviewed, no additional changes today Will refer to new spine specialist who works with your insurance if we are able. Our office will call regarding this appointment once made

## 2012-06-02 MED ORDER — CYCLOBENZAPRINE HCL 5 MG PO TABS: 5.0000 mg | ORAL_TABLET | Freq: Three times a day (TID) | ORAL | Status: DC | PRN

## 2012-06-02 NOTE — Telephone Encounter (Signed)
Notified pt with md response,,,lmb 

## 2012-06-02 NOTE — Telephone Encounter (Signed)
Change to Flexeril - if still too costly, please help me find what med is no formulary - thanks

## 2012-06-07 ENCOUNTER — Other Ambulatory Visit: Payer: Self-pay | Admitting: Internal Medicine

## 2012-07-08 ENCOUNTER — Other Ambulatory Visit: Payer: Self-pay | Admitting: Internal Medicine

## 2012-08-09 ENCOUNTER — Ambulatory Visit (INDEPENDENT_AMBULATORY_CARE_PROVIDER_SITE_OTHER): Payer: Medicare PPO | Admitting: Internal Medicine

## 2012-08-09 ENCOUNTER — Encounter: Payer: Self-pay | Admitting: Internal Medicine

## 2012-08-09 VITALS — BP 160/104 | HR 80 | Temp 98.2°F | Resp 16 | Ht 60.0 in | Wt 130.6 lb

## 2012-08-09 DIAGNOSIS — I10 Essential (primary) hypertension: Secondary | ICD-10-CM

## 2012-08-09 DIAGNOSIS — IMO0002 Reserved for concepts with insufficient information to code with codable children: Secondary | ICD-10-CM

## 2012-08-09 DIAGNOSIS — M5416 Radiculopathy, lumbar region: Secondary | ICD-10-CM

## 2012-08-09 DIAGNOSIS — M48061 Spinal stenosis, lumbar region without neurogenic claudication: Secondary | ICD-10-CM

## 2012-08-09 DIAGNOSIS — F341 Dysthymic disorder: Secondary | ICD-10-CM

## 2012-08-09 DIAGNOSIS — M549 Dorsalgia, unspecified: Secondary | ICD-10-CM

## 2012-08-09 MED ORDER — HYDROCODONE-ACETAMINOPHEN 7.5-325 MG PO TABS: 1.0000 | ORAL_TABLET | Freq: Three times a day (TID) | ORAL | Status: DC | PRN

## 2012-08-09 MED ORDER — ALPRAZOLAM 0.5 MG PO TABS: 0.5000 mg | ORAL_TABLET | Freq: Two times a day (BID) | ORAL | Status: DC | PRN

## 2012-08-09 MED ORDER — AMLODIPINE BESYLATE-VALSARTAN 5-320 MG PO TABS: 1.0000 | ORAL_TABLET | Freq: Every day | ORAL | Status: DC

## 2012-08-09 NOTE — Patient Instructions (Signed)

## 2012-08-09 NOTE — Progress Notes (Signed)
  Subjective:    Patient ID: Barbara [name], female    DOB: [birth year], 52 y.o.   MRN: [id number]  Hypertension This is a chronic problem. The current episode started more than 1 year ago. The problem has been gradually worsening since onset. The problem is uncontrolled. Associated symptoms include anxiety. Pertinent negatives include no blurred vision, chest pain, headaches, malaise/fatigue, neck pain, orthopnea, palpitations, peripheral edema, PND, shortness of breath or sweats. Past treatments include ACE inhibitors. The current treatment provides moderate improvement. Compliance problems include medication side effects, psychosocial issues, exercise and diet (intermittent nocturnal cough).       Review of Systems  Constitutional: Negative.  Negative for malaise/fatigue.  HENT: Negative.  Negative for neck pain.   Eyes: Negative.  Negative for blurred vision.  Respiratory: Positive for cough (NP, rare). Negative for choking, chest tightness, shortness of breath, wheezing and stridor.   Cardiovascular: Negative.  Negative for chest pain, palpitations, orthopnea, leg swelling and PND.  Gastrointestinal: Negative.  Negative for nausea, vomiting, abdominal pain, diarrhea and constipation.  Endocrine: Negative.   Genitourinary: Negative.   Musculoskeletal: Positive for back pain. Negative for joint swelling and gait problem.  Skin: Negative.   Allergic/Immunologic: Negative.   Neurological: Negative.  Negative for dizziness and headaches.  Hematological: Negative.  Negative for adenopathy. Does not bruise/bleed easily.  Psychiatric/Behavioral: Positive for sleep disturbance. Negative for suicidal ideas, hallucinations, behavioral problems, confusion, self-injury, decreased concentration and agitation. The patient is nervous/anxious. The patient is not hyperactive.        Objective:   Physical Exam  Vitals reviewed. Constitutional: She is oriented to person, place, and time. She appears  well-developed and well-nourished. No distress.  HENT:  Head: Normocephalic and atraumatic.  Mouth/Throat: Oropharynx is clear and moist. No oropharyngeal exudate.  Eyes: Conjunctivae are normal. Right eye exhibits no discharge. Left eye exhibits no discharge. No scleral icterus.  Neck: Normal range of motion. Neck supple. No JVD present. No tracheal deviation present. No thyromegaly present.  Cardiovascular: Normal rate, regular rhythm and intact distal pulses.  Exam reveals no gallop and no friction rub.   No murmur heard. Pulmonary/Chest: Effort normal and breath sounds normal. No stridor. No respiratory distress. She has no wheezes. She has no rales. She exhibits no tenderness.  Abdominal: Soft. Bowel sounds are normal. She exhibits no distension and no mass. There is no tenderness. There is no rebound and no guarding.  Musculoskeletal: Normal range of motion. She exhibits no edema and no tenderness.  Lymphadenopathy:    She has no cervical adenopathy.  Neurological: She is oriented to person, place, and time.  Skin: Skin is warm and dry. No rash noted. She is not diaphoretic. No erythema. No pallor.  Psychiatric: She has a normal mood and affect. Her behavior is normal. Judgment and thought content normal.     Lab Results  Component Value Date   WBC 7.4 04/29/2012   HGB 13.0 04/29/2012   HCT 38.5 04/29/2012   PLT 298.0 04/29/2012   GLUCOSE 95 12/23/2011   CHOL 191 09/23/2011   TRIG 127.0 09/23/2011   HDL 55.40 09/23/2011   LDLDIRECT 154.5 05/02/2011   LDLCALC 110* 09/23/2011   ALT 17 12/23/2011   AST 18 12/23/2011   NA 142 12/23/2011   K 4.0 12/23/2011   CL 102 12/23/2011   CREATININE 0.78 12/23/2011   BUN 17 12/23/2011   CO2 28 12/23/2011   TSH 0.79 05/02/2011       Assessment & Plan:

## 2012-08-09 NOTE — Assessment & Plan Note (Signed)
This is better s/s esi injection She will continue current meds for pain

## 2012-08-09 NOTE — Assessment & Plan Note (Signed)
Her BP is not well controlled She has stopped taking the ACEI due to cough She will start Exforge for BP control

## 2012-08-09 NOTE — Assessment & Plan Note (Signed)
She will continue xanax as needed 

## 2012-08-11 ENCOUNTER — Other Ambulatory Visit: Payer: Self-pay

## 2012-08-11 MED ORDER — TEMAZEPAM 30 MG PO CAPS: ORAL_CAPSULE | ORAL | Status: DC

## 2012-08-11 NOTE — Telephone Encounter (Signed)
Spoke with pt who advised that she will be reporting to a new [place] in [place] and will not return until august. I advised to take samples, call back in in 2-3wks to report readings. At that time MD can give future advisement and will send alternative to a local pharmacy there if needed. Pt also request 90 day supply of Temazepam. Thanks

## 2012-08-11 NOTE — Telephone Encounter (Signed)
Patient called stating that exforge rx will  Cost her $300 with insurance which she cannot afford. Per pt, inspite of lisinopril causing a cough, the price is more affordable. Please advise on medication

## 2012-08-11 NOTE — Telephone Encounter (Signed)
Take the samples, check the BP in 2-3 weeks, will change to generic if BP is well controlled on this combo  T. Yetta Barre

## 2012-09-07 ENCOUNTER — Other Ambulatory Visit: Payer: Self-pay | Admitting: *Deleted

## 2012-09-07 MED ORDER — CYCLOBENZAPRINE HCL 5 MG PO TABS: 5.0000 mg | ORAL_TABLET | Freq: Three times a day (TID) | ORAL | Status: DC | PRN

## 2012-09-30 ENCOUNTER — Ambulatory Visit (INDEPENDENT_AMBULATORY_CARE_PROVIDER_SITE_OTHER): Payer: Medicare PPO | Admitting: Internal Medicine

## 2012-09-30 ENCOUNTER — Encounter: Payer: Self-pay | Admitting: Internal Medicine

## 2012-09-30 VITALS — BP 110/70 | HR 86 | Temp 97.7°F | Resp 16 | Ht 60.0 in | Wt 128.0 lb

## 2012-09-30 DIAGNOSIS — I10 Essential (primary) hypertension: Secondary | ICD-10-CM

## 2012-09-30 NOTE — Patient Instructions (Addendum)

## 2012-09-30 NOTE — Assessment & Plan Note (Signed)
Her BP is well controlled 

## 2012-09-30 NOTE — Progress Notes (Signed)
  Subjective:    Patient ID: Barbara [name], female    DOB: April 25, 1960, 52 y.o.   MRN: [id number]  Hypertension This is a chronic problem. The current episode started more than 1 year ago. The problem is unchanged. The problem is controlled. Associated symptoms include anxiety. Pertinent negatives include no blurred vision, chest pain, headaches, malaise/fatigue, neck pain, orthopnea, palpitations, peripheral edema, PND, shortness of breath or sweats. Past treatments include calcium channel blockers and angiotensin blockers. The current treatment provides significant improvement. There are no compliance problems.       Review of Systems  Constitutional: Negative.  Negative for malaise/fatigue.  HENT: Negative.  Negative for neck pain.   Eyes: Negative.  Negative for blurred vision.  Respiratory: Negative.  Negative for cough, chest tightness, shortness of breath, wheezing and stridor.   Cardiovascular: Negative.  Negative for chest pain, palpitations, orthopnea, leg swelling and PND.  Gastrointestinal: Negative.  Negative for nausea, vomiting, abdominal pain, diarrhea, constipation and blood in stool.  Endocrine: Negative.   Genitourinary: Negative.   Musculoskeletal: Positive for back pain. Negative for myalgias, joint swelling, arthralgias and gait problem.  Skin: Negative.  Negative for color change, pallor, rash and wound.  Allergic/Immunologic: Negative.   Neurological: Negative.  Negative for headaches.  Hematological: Negative.  Negative for adenopathy. Does not bruise/bleed easily.  Psychiatric/Behavioral: Negative for suicidal ideas, hallucinations, behavioral problems, confusion, sleep disturbance, self-injury, dysphoric mood, decreased concentration and agitation. The patient is nervous/anxious. The patient is not hyperactive.        Objective:   Physical Exam  Vitals reviewed. Constitutional: She is oriented to person, place, and time. She appears well-developed and  well-nourished. No distress.  HENT:  Head: Normocephalic and atraumatic.  Mouth/Throat: Oropharynx is clear and moist. No oropharyngeal exudate.  Eyes: Conjunctivae are normal. Right eye exhibits no discharge. Left eye exhibits no discharge. No scleral icterus.  Neck: Normal range of motion. Neck supple. No JVD present. No tracheal deviation present. No thyromegaly present.  Cardiovascular: Normal rate, regular rhythm, normal heart sounds and intact distal pulses.  Exam reveals no gallop and no friction rub.   No murmur heard. Pulmonary/Chest: Effort normal and breath sounds normal. No stridor. No respiratory distress. She has no wheezes. She has no rales. She exhibits no tenderness.  Abdominal: Soft. Bowel sounds are normal. She exhibits no distension and no mass. There is no tenderness. There is no rebound and no guarding.  Musculoskeletal: Normal range of motion. She exhibits no edema and no tenderness.  Lymphadenopathy:    She has no cervical adenopathy.  Neurological: She is oriented to person, place, and time.  Skin: Skin is warm and dry. No rash noted. She is not diaphoretic. No erythema. No pallor.  Psychiatric: She has a normal mood and affect. Her behavior is normal. Judgment and thought content normal.          Assessment & Plan:

## 2012-10-06 ENCOUNTER — Other Ambulatory Visit: Payer: Self-pay | Admitting: Internal Medicine

## 2012-11-09 ENCOUNTER — Ambulatory Visit (INDEPENDENT_AMBULATORY_CARE_PROVIDER_SITE_OTHER): Payer: Medicare PPO

## 2012-11-09 ENCOUNTER — Encounter: Payer: Self-pay | Admitting: Internal Medicine

## 2012-11-09 ENCOUNTER — Ambulatory Visit (INDEPENDENT_AMBULATORY_CARE_PROVIDER_SITE_OTHER)
Admission: RE | Admit: 2012-11-09 | Discharge: 2012-11-09 | Disposition: A | Discharge status: Home / Self Care | Payer: Medicare PPO | Source: Ambulatory Visit | Attending: Internal Medicine | Admitting: Internal Medicine

## 2012-11-09 ENCOUNTER — Ambulatory Visit (INDEPENDENT_AMBULATORY_CARE_PROVIDER_SITE_OTHER): Payer: Medicare PPO | Admitting: Internal Medicine

## 2012-11-09 VITALS — BP 150/92 | HR 72 | Temp 98.5°F | Resp 16 | Wt 132.0 lb

## 2012-11-09 DIAGNOSIS — R109 Unspecified abdominal pain: Secondary | ICD-10-CM

## 2012-11-09 DIAGNOSIS — K760 Fatty (change of) liver, not elsewhere classified: Secondary | ICD-10-CM | POA: Insufficient documentation

## 2012-11-09 DIAGNOSIS — F341 Dysthymic disorder: Secondary | ICD-10-CM

## 2012-11-09 DIAGNOSIS — R52 Pain, unspecified: Secondary | ICD-10-CM

## 2012-11-09 DIAGNOSIS — I10 Essential (primary) hypertension: Secondary | ICD-10-CM

## 2012-11-09 LAB — URINALYSIS, ROUTINE W REFLEX MICROSCOPIC
Bilirubin Urine: NEGATIVE
Ketones, ur: NEGATIVE
Leukocytes, UA: NEGATIVE
Nitrite: NEGATIVE
Urobilinogen, UA: 0.2 (ref 0.0–1.0)
pH: 5.5 (ref 5.0–8.0)

## 2012-11-09 LAB — SEDIMENTATION RATE: Sed Rate: 31 mm/hr — ABNORMAL HIGH (ref 0–22)

## 2012-11-09 LAB — COMPREHENSIVE METABOLIC PANEL
ALT: 65 U/L — ABNORMAL HIGH (ref 0–35)
AST: 36 U/L (ref 0–37)
CO2: 24 mEq/L (ref 19–32)
Calcium: 10 mg/dL (ref 8.4–10.5)
Chloride: 102 mEq/L (ref 96–112)
GFR: 117.74 mL/min (ref >60.00)
Sodium: 138 mEq/L (ref 135–145)
Total Protein: 7.4 g/dL (ref 6.0–8.3)

## 2012-11-09 LAB — CBC WITH DIFFERENTIAL/PLATELET
Basophils Absolute: 0 10*3/uL (ref 0.0–0.1)
Eosinophils Absolute: 0.1 10*3/uL (ref 0.0–0.7)
HCT: 39.3 % (ref 36.0–46.0)
Lymphocytes Relative: 24.5 % (ref 12.0–46.0)
Lymphs Abs: 1.7 10*3/uL (ref 0.7–4.0)
MCHC: 33 g/dL (ref 30.0–36.0)
Monocytes Relative: 4.2 % (ref 3.0–12.0)
Platelets: 309 10*3/uL (ref 150.0–400.0)
RDW: 12.9 % (ref 11.5–14.6)

## 2012-11-09 LAB — AMYLASE: Amylase: 69 U/L (ref 27–131)

## 2012-11-09 NOTE — Assessment & Plan Note (Signed)
She has adequate BP control 

## 2012-11-09 NOTE — Assessment & Plan Note (Signed)
The xray shows nonspecific a/f levels, she will let me know how her symptoms progress. Her labs show no evidence of UTI, kidney stone, pancreatitis, infection, anemia. Her ESR is very slightly elevated but not high enough to be of concern related to vasculitis, IBD

## 2012-11-09 NOTE — Patient Instructions (Signed)

## 2012-11-09 NOTE — Assessment & Plan Note (Signed)
The LFT pattern is nonspecific, I have asked her to get an u/s done to check the contour of her liver (fatty, tumors, etc.)

## 2012-11-09 NOTE — Assessment & Plan Note (Signed)
I will check her UDS to see if she is compliant with her opiate and BZD therapy

## 2012-11-09 NOTE — Progress Notes (Signed)
Subjective:    Patient ID: Barbara [name], female    DOB: 06/01/1960, 52 y.o.   MRN: [id number]  Abdominal Pain This is a new problem. The current episode started yesterday. The onset quality is sudden. The problem occurs constantly. The most recent episode lasted 1 day. The problem has been unchanged. The pain is located in the left flank. The pain is mild. Quality: "pinching and twisting" The abdominal pain does not radiate. Associated symptoms include diarrhea (she had some diarrhea yesterday but none today). Pertinent negatives include no anorexia, arthralgias, belching, constipation, dysuria, fever, flatus, frequency, headaches, hematochezia, hematuria, melena, myalgias, nausea, vomiting or weight loss. Nothing aggravates the pain. The pain is relieved by nothing. She has tried oral narcotic analgesics for the symptoms. The treatment provided moderate relief. Her past medical history is significant for irritable bowel syndrome. There is no history of abdominal surgery, gallstones, GERD, pancreatitis, PUD or ulcerative colitis.      Review of Systems  Constitutional: Negative.  Negative for fever, chills, weight loss, diaphoresis, activity change, appetite change, fatigue and unexpected weight change.  HENT: Negative.   Eyes: Negative.   Respiratory: Negative.  Negative for cough, chest tightness, shortness of breath, wheezing and stridor.   Cardiovascular: Negative.  Negative for chest pain, palpitations and leg swelling.  Gastrointestinal: Positive for abdominal pain and diarrhea (she had some diarrhea yesterday but none today). Negative for nausea, vomiting, constipation, melena, hematochezia, abdominal distention, anal bleeding, rectal pain, anorexia and flatus.  Endocrine: Negative.   Genitourinary: Negative.  Negative for dysuria, urgency, frequency, hematuria, flank pain, decreased urine volume, vaginal bleeding, vaginal discharge, enuresis, difficulty urinating, genital sores, vaginal  pain, menstrual problem, pelvic pain and dyspareunia.  Musculoskeletal: Positive for back pain. Negative for myalgias, joint swelling, arthralgias and gait problem.  Skin: Negative.   Allergic/Immunologic: Negative.   Neurological: Negative.  Negative for dizziness, tremors, weakness, light-headedness and headaches.  Hematological: Negative.  Negative for adenopathy. Does not bruise/bleed easily.  Psychiatric/Behavioral: Negative.        Objective:   Physical Exam  Vitals reviewed. Constitutional: She is oriented to person, place, and time. She appears well-developed and well-nourished.  Non-toxic appearance. She does not have a sickly appearance. She does not appear ill. No distress.  HENT:  Head: Normocephalic and atraumatic.  Mouth/Throat: Oropharynx is clear and moist. No oropharyngeal exudate.  Eyes: Conjunctivae are normal. Right eye exhibits no discharge. Left eye exhibits no discharge. No scleral icterus.  Neck: Normal range of motion. Neck supple. No JVD present. No tracheal deviation present. No thyromegaly present.  Cardiovascular: Normal rate, regular rhythm, normal heart sounds and intact distal pulses.  Exam reveals no gallop and no friction rub.   No murmur heard. Pulmonary/Chest: Effort normal and breath sounds normal. No stridor. No respiratory distress. She has no wheezes. She has no rales. She exhibits no tenderness.  Abdominal: Soft. Normal appearance and bowel sounds are normal. She exhibits no distension, no abdominal bruit and no mass. There is no hepatosplenomegaly, splenomegaly or hepatomegaly. There is generalized tenderness. There is no rigidity, no rebound, no guarding, no CVA tenderness, no tenderness at McBurney's point and negative Murphy's sign. No hernia. Hernia confirmed negative in the ventral area, confirmed negative in the right inguinal area and confirmed negative in the left inguinal area.  Musculoskeletal: Normal range of motion. She exhibits no edema  and no tenderness.  Lymphadenopathy:    She has no cervical adenopathy.  Neurological: She is oriented to person, place, and time.  Skin: Skin is warm and dry. No rash noted. She is not diaphoretic. No erythema. No pallor.  Psychiatric: She has a normal mood and affect. Her behavior is normal. Judgment and thought content normal.      Lab Results  Component Value Date   WBC 7.0 11/09/2012   HGB 13.0 11/09/2012   HCT 39.3 11/09/2012   PLT 309.0 11/09/2012   GLUCOSE 103* 11/09/2012   CHOL 191 09/23/2011   TRIG 127.0 09/23/2011   HDL 55.40 09/23/2011   LDLDIRECT 154.5 05/02/2011   LDLCALC 110* 09/23/2011   ALT 65* 11/09/2012   AST 36 11/09/2012   NA 138 11/09/2012   K 4.3 11/09/2012   CL 102 11/09/2012   CREATININE 0.6 11/09/2012   BUN 12 11/09/2012   CO2 24 11/09/2012   TSH 0.79 05/02/2011      Assessment & Plan:

## 2012-11-10 ENCOUNTER — Telehealth: Payer: Self-pay

## 2012-11-10 ENCOUNTER — Encounter: Payer: Self-pay | Admitting: Internal Medicine

## 2012-11-10 LAB — DRUGS OF ABUSE SCREEN W/O ALC, ROUTINE URINE
Amphetamine Screen, Ur: NEGATIVE
Benzodiazepines.: POSITIVE — AB
Cocaine Metabolites: NEGATIVE
Marijuana Metabolite: POSITIVE — AB
Opiate Screen, Urine: NEGATIVE
Phencyclidine (PCP): NEGATIVE
Propoxyphene: NEGATIVE

## 2012-11-10 NOTE — Telephone Encounter (Signed)
FYI.Marland KitchenMarland KitchenPatient called [facility]mov[facility] stating that she received her U/A and lab results and believe these to be inaccurate. Spoke with pt who stated that she does not understand why her drug screen didn't show positive for opiates because she has been taking her medication consistently. She also  States that the positive benzo could be from her temazepam and unsure if xanax would be in that category. Pt very concern and has scheduled a follow up appt to discuss all labs as well as [place] scheduled for this Thursday.

## 2012-11-11 ENCOUNTER — Encounter: Payer: Self-pay | Admitting: Internal Medicine

## 2012-11-11 ENCOUNTER — Ambulatory Visit
Admission: RE | Admit: 2012-11-11 | Discharge: 2012-11-11 | Disposition: A | Discharge status: Home / Self Care | Payer: Medicare PPO | Source: Ambulatory Visit | Attending: Internal Medicine | Admitting: Internal Medicine

## 2012-11-11 DIAGNOSIS — R109 Unspecified abdominal pain: Secondary | ICD-10-CM

## 2012-11-11 LAB — CANNABANOIDS (GC/LC/MS), URINE: THC-COOH (GC/LC/MS), ur confirm: 63 ng/mL

## 2012-11-13 LAB — BENZODIAZEPINES (GC/LC/MS), URINE
Diazepam (GC/LC/MS), ur confirm: NEGATIVE ng/mL
Estazolam (GC/LC/MS), ur confirm: NEGATIVE ng/mL
Flurazepam metabolite (GC/LC/MS), ur confirm: NEGATIVE ng/mL
Midazolam (GC/LC/MS), ur confirm: NEGATIVE ng/mL
Nordiazepam (GC/LC/MS), ur confirm: NEGATIVE ng/mL
Oxazepam (GC/LC/MS), ur confirm: 439 ng/mL
Temazepam (GC/LC/MS), ur confirm: 9481 ng/mL
Triazolam metabolite (GC/LC/MS), ur confirm: NEGATIVE ng/mL

## 2012-11-15 ENCOUNTER — Ambulatory Visit (INDEPENDENT_AMBULATORY_CARE_PROVIDER_SITE_OTHER): Payer: Medicare PPO | Admitting: Internal Medicine

## 2012-11-15 ENCOUNTER — Encounter: Payer: Self-pay | Admitting: Internal Medicine

## 2012-11-15 VITALS — BP 112/64 | HR 80 | Temp 98.8°F | Resp 16 | Wt 132.0 lb

## 2012-11-15 DIAGNOSIS — I10 Essential (primary) hypertension: Secondary | ICD-10-CM

## 2012-11-15 DIAGNOSIS — F341 Dysthymic disorder: Secondary | ICD-10-CM

## 2012-11-15 DIAGNOSIS — M549 Dorsalgia, unspecified: Secondary | ICD-10-CM

## 2012-11-15 NOTE — Assessment & Plan Note (Signed)
Her UDS was negative for opiates so I have discontinued the norco

## 2012-11-15 NOTE — Progress Notes (Signed)
  Subjective:    Patient ID: Barbara [name], female    DOB: 29-Jul-1960, 52 y.o.   MRN: [id number]  Hypertension This is a chronic problem. The current episode started more than 1 year ago. The problem is unchanged. The problem is controlled. Associated symptoms include anxiety. Pertinent negatives include no blurred vision, chest pain, headaches, malaise/fatigue, neck pain, orthopnea, palpitations, peripheral edema, PND, shortness of breath or sweats. There are no associated agents to hypertension. Past treatments include angiotensin blockers and calcium channel blockers. The current treatment provides significant improvement. Compliance problems include exercise and diet.       Review of Systems  Constitutional: Negative.  Negative for malaise/fatigue.  HENT: Negative.  Negative for neck pain.   Eyes: Negative.  Negative for blurred vision.  Respiratory: Negative.  Negative for apnea, cough, choking, chest tightness, shortness of breath, wheezing and stridor.   Cardiovascular: Negative.  Negative for chest pain, palpitations, orthopnea, leg swelling and PND.  Gastrointestinal: Negative.  Negative for nausea, vomiting, abdominal pain, diarrhea and constipation.  Endocrine: Negative.   Genitourinary: Negative.   Musculoskeletal: Negative.  Negative for myalgias, back pain, joint swelling, arthralgias and gait problem.  Allergic/Immunologic: Negative.   Neurological: Negative.  Negative for dizziness and headaches.  Hematological: Negative.  Negative for adenopathy. Does not bruise/bleed easily.  Psychiatric/Behavioral: Negative.        Objective:   Physical Exam  Vitals reviewed. Constitutional: She is oriented to person, place, and time. She appears well-developed and well-nourished.  Non-toxic appearance. She does not have a sickly appearance. She does not appear ill. No distress.  HENT:  Head: Normocephalic and atraumatic.  Mouth/Throat: Oropharynx is clear and moist. No oropharyngeal  exudate.  Eyes: Conjunctivae are normal. Right eye exhibits no discharge. Left eye exhibits no discharge. No scleral icterus.  Neck: Normal range of motion. Neck supple. No JVD present. No tracheal deviation present. No thyromegaly present.  Cardiovascular: Normal rate, regular rhythm, normal heart sounds and intact distal pulses.  Exam reveals no gallop and no friction rub.   No murmur heard. Pulmonary/Chest: Effort normal and breath sounds normal. No stridor. No respiratory distress. She has no wheezes. She has no rales. She exhibits no tenderness.  Abdominal: Soft. Bowel sounds are normal. She exhibits no distension and no mass. There is no tenderness. There is no rebound and no guarding.  Musculoskeletal: Normal range of motion. She exhibits no edema and no tenderness.  Lymphadenopathy:    She has no cervical adenopathy.  Neurological: She is oriented to person, place, and time.  Skin: Skin is warm and dry. No rash noted. She is not diaphoretic. No erythema. No pallor.  Psychiatric: She has a normal mood and affect. Her behavior is normal. Judgment and thought content normal.     Lab Results  Component Value Date   WBC 7.0 11/09/2012   HGB 13.0 11/09/2012   HCT 39.3 11/09/2012   PLT 309.0 11/09/2012   GLUCOSE 103* 11/09/2012   CHOL 191 09/23/2011   TRIG 127.0 09/23/2011   HDL 55.40 09/23/2011   LDLDIRECT 154.5 05/02/2011   LDLCALC 110* 09/23/2011   ALT 65* 11/09/2012   AST 36 11/09/2012   NA 138 11/09/2012   K 4.3 11/09/2012   CL 102 11/09/2012   CREATININE 0.6 11/09/2012   BUN 12 11/09/2012   CO2 24 11/09/2012   TSH 0.79 05/02/2011       Assessment & Plan:

## 2012-11-15 NOTE — Assessment & Plan Note (Signed)
Her UDS was negative for xanax so I have discontinued it

## 2012-11-15 NOTE — Patient Instructions (Addendum)

## 2012-11-15 NOTE — Assessment & Plan Note (Signed)
Her BP is well controlled 

## 2012-12-06 ENCOUNTER — Other Ambulatory Visit: Payer: Self-pay | Admitting: Internal Medicine

## 2012-12-06 ENCOUNTER — Telehealth: Payer: Self-pay | Admitting: *Deleted

## 2012-12-06 DIAGNOSIS — I10 Essential (primary) hypertension: Secondary | ICD-10-CM

## 2012-12-06 MED ORDER — AMLODIPINE BESYLATE 5 MG PO TABS: 5.0000 mg | ORAL_TABLET | Freq: Every day | ORAL | Status: AC

## 2012-12-06 MED ORDER — LOSARTAN POTASSIUM 100 MG PO TABS: 100.0000 mg | ORAL_TABLET | Freq: Every day | ORAL | Status: AC

## 2012-12-06 NOTE — Telephone Encounter (Signed)
Pt called requesting additional samples of Exforge, 84 tablets placed in cabinet for pt pick up.  states Exforge 5/320mg  is too expensive pt is requesting a different Rx for medication less expensive.

## 2013-01-05 ENCOUNTER — Ambulatory Visit (INDEPENDENT_AMBULATORY_CARE_PROVIDER_SITE_OTHER): Payer: Medicare PPO | Admitting: Internal Medicine

## 2013-01-05 ENCOUNTER — Encounter: Payer: Self-pay | Admitting: Internal Medicine

## 2013-01-05 VITALS — BP 130/80 | HR 117 | Temp 98.5°F | Ht 59.0 in | Wt 129.4 lb

## 2013-01-05 DIAGNOSIS — G894 Chronic pain syndrome: Secondary | ICD-10-CM

## 2013-01-05 NOTE — Assessment & Plan Note (Signed)
I d/w pt the fact of her last UDS neg for opiates, making it appear she was not taking it on a regular basis, despite her statements that she was.  I stated I cannot reverse a decision by Dr Yetta Barre in this matter, and declined further pain med refill.  Pt left angrily, stating she will likely end up in the hospital, and that she did not intend to pay for the visit.

## 2013-01-05 NOTE — Patient Instructions (Signed)
I am unable to help with your chronic pain medication prescription today as Dr Yetta Barre has discontinued your pain medication refills, given your negative urine drug test  Please continue all other medications as before

## 2013-01-05 NOTE — Progress Notes (Signed)
  Subjective:    Patient ID: Barbara [name], female    DOB: 1961-01-26, 52 y.o.   MRN: [id number]  HPI  Here to f/u, being tx for "lyme dz" after seen by a provider in the ER yesterday, Pt continues to have recurring LBP without change in severity, bowel or bladder change, fever, wt loss,  worsening LE pain/numbness/weakness, gait change or falls. Asks for pain med refill.  Chart indicates UDS last visit neg for opitates and Dr Yetta Barre declines further refills and took the med off of her med list.   Past Medical History  Diagnosis Date  . Hyperlipidemia   . HTN (hypertension)   . Cervical cancer   . Fibromyalgia   . Glaucoma   . Anxiety   . IBS (irritable bowel syndrome)   . Internal and external hemorrhoids without complication   . Depression   . Cervical disc disease    Past Surgical History  Procedure Laterality Date  . Cholecystectomy    . Inguinal hernia repair    . Abdominal hysterectomy    . Oophorectomy    . Colonoscopy    . Breast lumpectomy      reports that she quit smoking about 17 months ago. Her smoking use included Cigarettes. She has a 30 pack-year smoking history. She has never used smokeless tobacco. She reports that she drinks about 1.8 ounces of alcohol per week. She reports that she does not use illicit drugs. family history is not on file. She was adopted. Allergies  Allergen Reactions  . Erythromycin Shortness Of Breath and Itching  . Penicillins Shortness Of Breath and Itching  . Lisinopril     cough  . Codeine Itching   Current Outpatient Prescriptions on File Prior to Visit  Medication Sig Dispense Refill  . amLODipine (NORVASC) 5 MG tablet Take 1 tablet (5 mg total) by mouth daily.  90 tablet  3  . cyclobenzaprine (FLEXERIL) 5 MG tablet TAKE ONE TABLET BY MOUTH EVERY 8 HOURS AS NEEDED FOR MUSCLE SPASM  40 tablet  11  . losartan (COZAAR) 100 MG tablet Take 1 tablet (100 mg total) by mouth daily.  90 tablet  3  . Multiple Vitamins-Minerals (ALIVE WOMENS  50+ PO) Take 1 tablet by mouth daily.      . temazepam (RESTORIL) 30 MG capsule TAKE ONE CAPSULE BY MOUTH AT BEDTIME AS NEEDED  90 capsule  1   No current facility-administered medications on file prior to visit.   Review of Systems Pt declined ROS, left before end of visit    Objective:   Physical Exam BP 130/80  Pulse 117  Temp(Src) 98.5 F (36.9 C) (Oral)  Ht 4\' 11"  (1.499 m)  Wt 129 lb 6 oz (58.684 kg)  BMI 26.12 kg/m2  SpO2 95%  LMP 08/09/2012 Pt declined exam, left before end of visit       Assessment & Plan:

## 2013-01-21 ENCOUNTER — Other Ambulatory Visit: Payer: Self-pay

## 2013-01-21 ENCOUNTER — Encounter: Payer: Self-pay | Admitting: Internal Medicine

## 2013-02-03 ENCOUNTER — Other Ambulatory Visit: Payer: Self-pay

## 2013-05-12 IMAGING — CT CT ABD-PELV W/ CM
2 of 5 series · 17 of 46 positions shown, 19 images · IV contrast (Omnipaque 300)
Comparison: 01/16/2010

CLINICAL DATA: Right upper quadrant pain.  Microscopic hematuria.

CT ABDOMEN AND PELVIS WITH CONTRAST
TECHNIQUE: Multidetector CT imaging of the abdomen and pelvis was
performed following the standard protocol during bolus
administration of intravenous contrast.
Contrast: 100mL OMNIPAQUE IOHEXOL 300 MG/ML  SOLN

[Series 2: abd/ pel 5mm · axial · 0.65mm/px · z∈[-378,+6]mm · 14 of 87 slices shown, 16 images]
[im 5/87  soft-tissue]
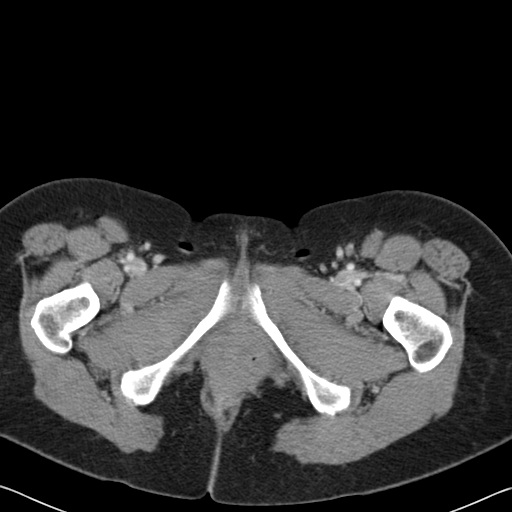
[im 5/87  bone]
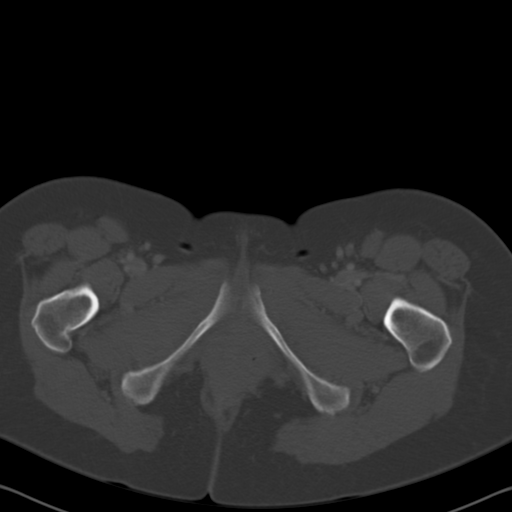
[im 10/87  soft-tissue]
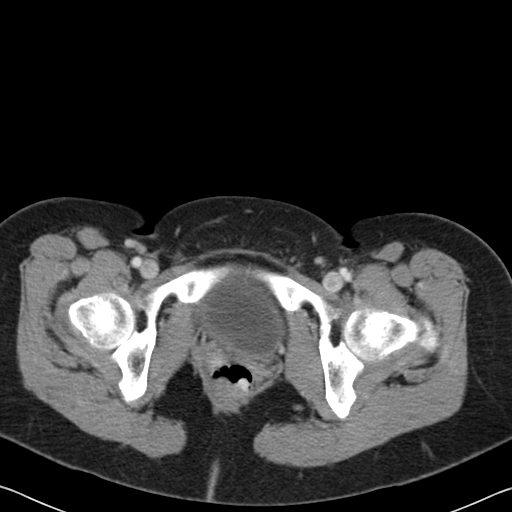
[im 19/87  soft-tissue]
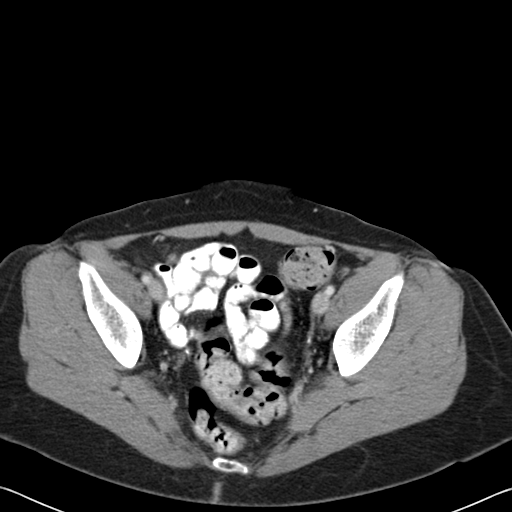
[im 23/87  soft-tissue]
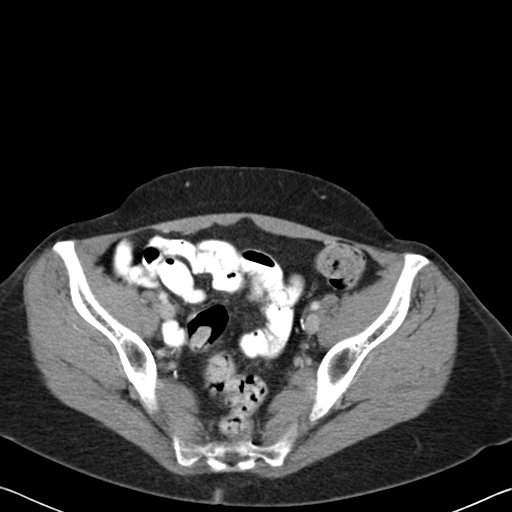
[im 28/87  soft-tissue]
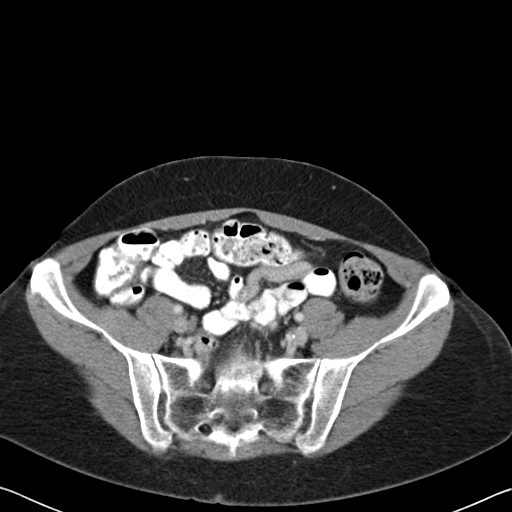
[im 37/87  soft-tissue]
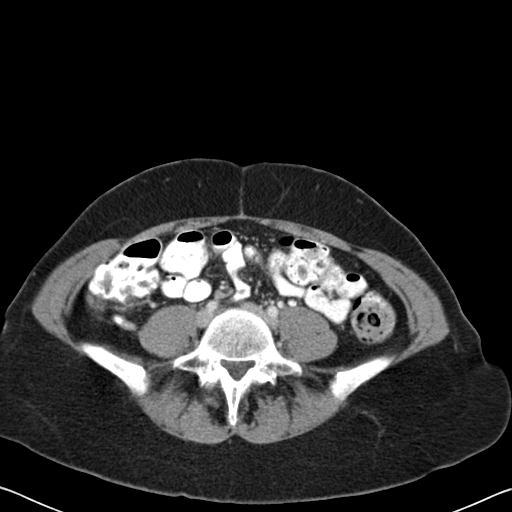
[im 41/87  soft-tissue]
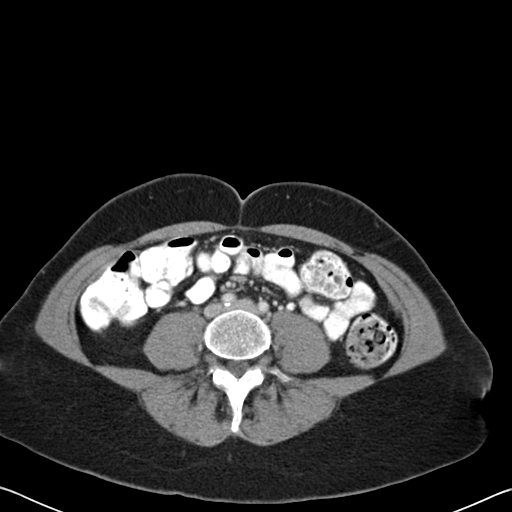
[im 46/87  soft-tissue]
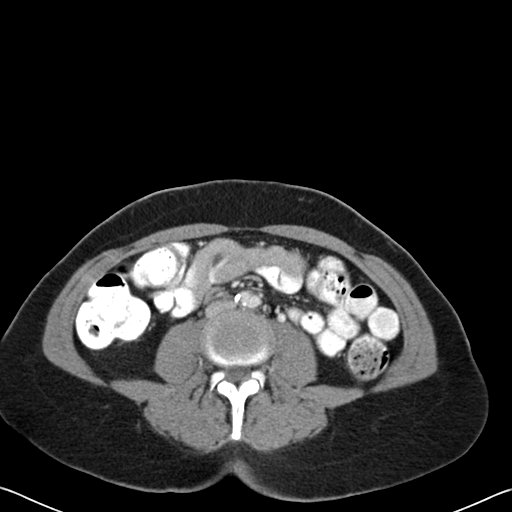
[im 50/87  soft-tissue]
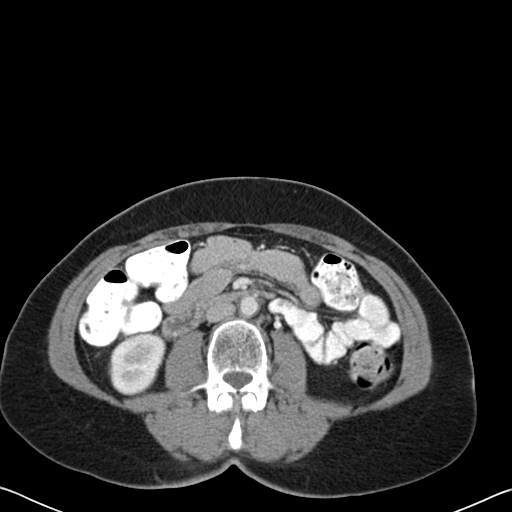
[im 50/87  bone]
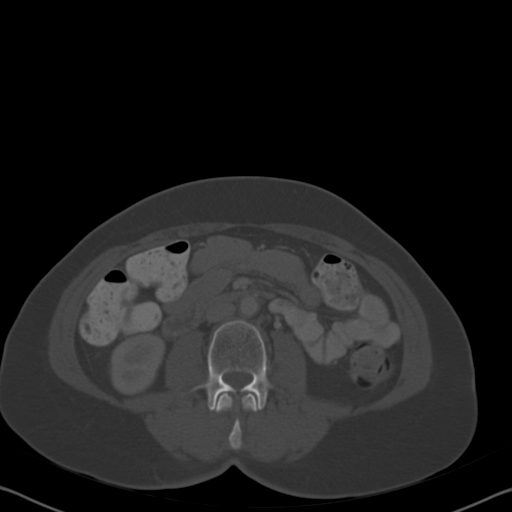
[im 59/87  soft-tissue]
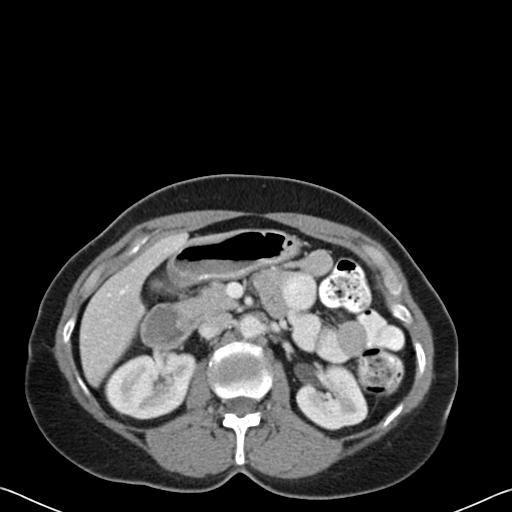
[im 64/87  soft-tissue]
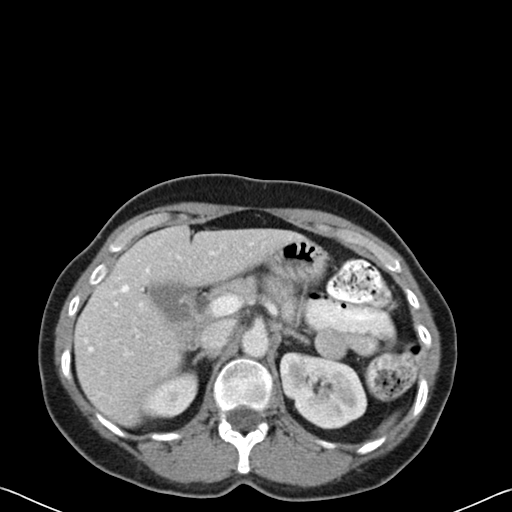
[im 68/87  soft-tissue]
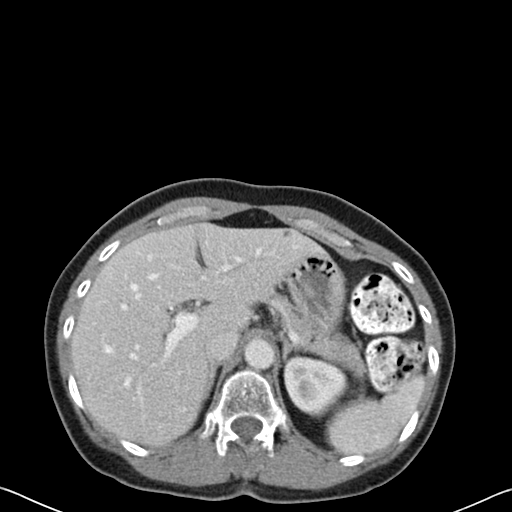
[im 77/87  soft-tissue]
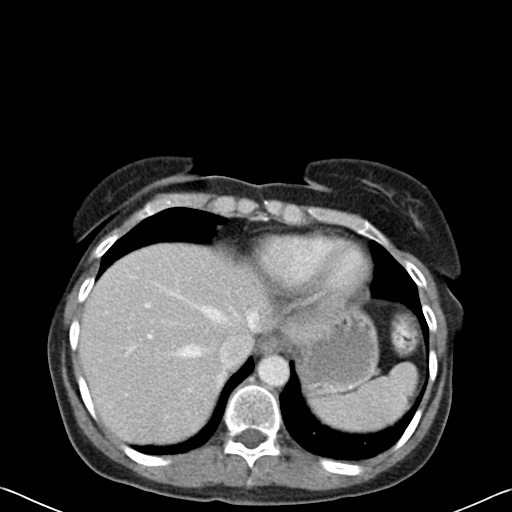
[im 82/87  soft-tissue]
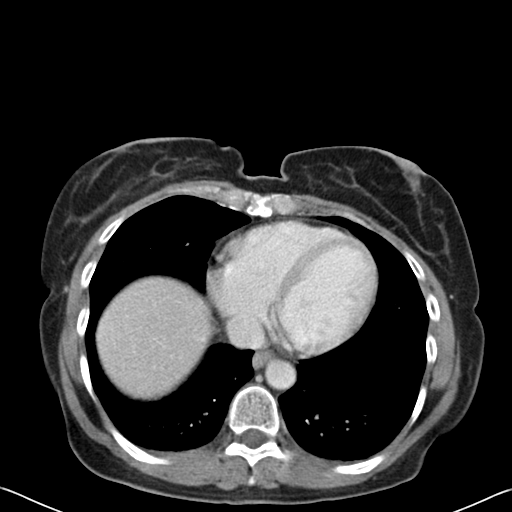

[Series 602: cor · coronal · 0.87mm/px · 3 of 77 slices shown]
[im 26/77  soft-tissue]
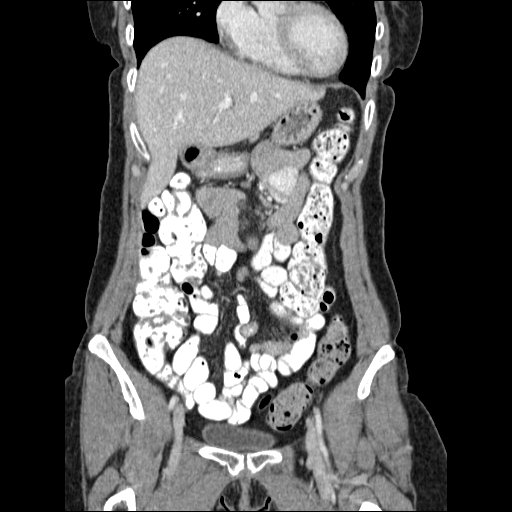
[im 34/77  soft-tissue]
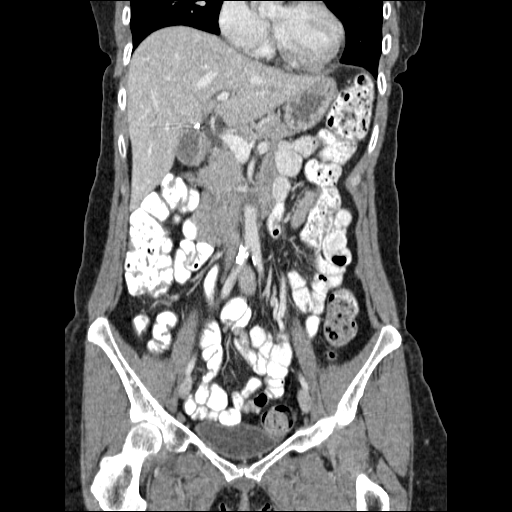
[im 43/77  soft-tissue]
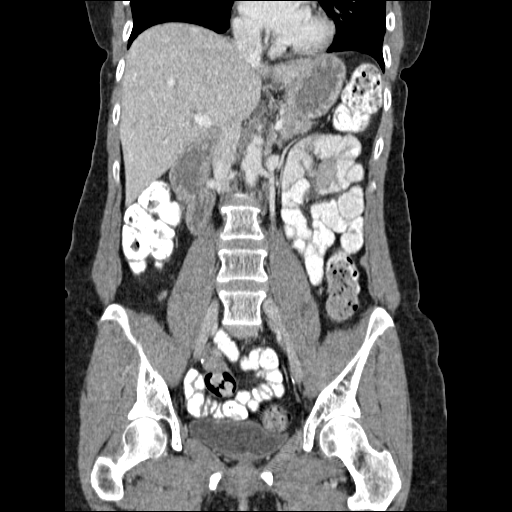

[17 of 46 positions shown; findings below may reference images not displayed]

FINDINGS: Small scattered low density lesions within the liver,
stable since prior study, likely small cysts.  Prior
cholecystectomy.  Spleen, pancreas, stomach, adrenals, kidneys are
unremarkable.  Appendix is visualized and is normal.  Bowel grossly
unremarkable.  No free fluid, free air, or adenopathy.  Prior
hysterectomy.  Urinary bladder and adnexa unremarkable.  No acute
bony abnormality.

Lung bases are clear.  No effusions.  Heart is normal size.
IMPRESSION: Prior cholecystectomy and hysterectomy.

Normal appendix.

No acute findings in the abdomen or pelvis.

## 2013-08-16 ENCOUNTER — Telehealth: Payer: Self-pay | Admitting: Oncology

## 2013-08-16 NOTE — Telephone Encounter (Signed)
pt cld to see if she could get an appt today/adv no openings. Pt stated will keep her 6/26 appt/Adv I would note the account

## 2013-12-12 ENCOUNTER — Encounter: Payer: Self-pay | Admitting: Internal Medicine

## 2018-09-08 ENCOUNTER — Ambulatory Visit (HOSPITAL_COMMUNITY): Payer: Self-pay | Admitting: PSYCHIATRY AND NEUROLOGY-NEUROLOGY

## 2019-02-01 ENCOUNTER — Ambulatory Visit (INDEPENDENT_AMBULATORY_CARE_PROVIDER_SITE_OTHER): Payer: Self-pay | Admitting: INTERNAL MEDICINE-ENDOCRINOLOGY-DIABETES AND METABOLISM

## 2019-03-01 ENCOUNTER — Other Ambulatory Visit: Payer: Self-pay

## 2019-03-01 ENCOUNTER — Ambulatory Visit (INDEPENDENT_AMBULATORY_CARE_PROVIDER_SITE_OTHER): Admitting: INTERNAL MEDICINE-ENDOCRINOLOGY-DIABETES AND METABOLISM

## 2019-03-01 ENCOUNTER — Encounter (INDEPENDENT_AMBULATORY_CARE_PROVIDER_SITE_OTHER): Payer: Self-pay | Admitting: INTERNAL MEDICINE-ENDOCRINOLOGY-DIABETES AND METABOLISM

## 2019-03-01 VITALS — BP 118/68 | HR 87 | Ht 59.0 in | Wt 176.6 lb

## 2019-03-01 DIAGNOSIS — M8580 Other specified disorders of bone density and structure, unspecified site: Secondary | ICD-10-CM

## 2019-03-01 DIAGNOSIS — R7303 Prediabetes: Secondary | ICD-10-CM

## 2019-03-01 DIAGNOSIS — E890 Postprocedural hypothyroidism: Secondary | ICD-10-CM

## 2019-03-01 DIAGNOSIS — E0500 Thyrotoxicosis with diffuse goiter without thyrotoxic crisis or storm: Secondary | ICD-10-CM

## 2019-03-01 DIAGNOSIS — Z6835 Body mass index (BMI) 35.0-35.9, adult: Secondary | ICD-10-CM

## 2019-03-01 LAB — HGA1C (HEMOGLOBIN A1C WITH EST AVG GLUCOSE)
ESTIMATED AVERAGE GLUCOSE: 157 mg/dL — ABNORMAL HIGH (ref 77–114)
HEMOGLOBIN A1C: 7.1 %

## 2019-03-01 LAB — THYROID STIMULATING HORMONE (SENSITIVE TSH): TSH: 0.53 u[IU]/mL (ref 0.450–5.330)

## 2019-03-01 LAB — THYROXINE, FREE (FREE T4): THYROXINE, FREE (FREE T4): 1.18 ng/dL (ref 0.60–1.30)

## 2019-03-01 NOTE — Progress Notes (Signed)
ENDOCRINOLOGY, MEDICAL STAFF OFFICE BLDG  [address]  [place] [address]       Name: Kathleen Rios MRN:  [id number]   Date: 03/01/2019 Age: 58 y.o.       -Referring Provider for today's visit: Francene Castle, MD  -Primary Care Provider: Francene Castle, MD    chief complaint: [name]    History of Present of Illness  Kathleen Rios is a 58 y.o. female who presents to the Endocrinology Clinic for [name].  She has been referred by Dr.  Francene Castle, MD.     Summary:  She has a history of Graves' disease, CAD (on conservative management), myasthenia gravis, OP    Graves' disease:   Diagnosed with Graves' disease in 2014. She is s/p RAI x 3 in 2014, and once in 2015. She is now on LT4 150 mcg daily, since beginning of 2020. She takes it at 5 am in the morning.s he waits 2 hours before taking other medicines, including PPI and Carafate, Ca supplements. She was also started on prednisone 5 yrs ago (was on higher doses like 50 mg before, currently on 20 mg), for MG and GO. She sees Dr.Lewis (neuro) and recently had blood tests to rule out MG (pending). She sees Dr.Kadikoy in Ophthalmology. In the morning, her eyes are more priominant, and as the day passes, her eyes become more droopy (R>L). She does have muscle weakness.     ROS:  Fatigue - none  Constipation - loose stools   Weight gain - 50 lbs in past year   Cold intolerance   Hair loss - yes   Dry skin - some   Sleep and appetite - good   Tremors - yes  Hot flashes - daily    Smoking - ex smoker, quit in 2013.     Prediabetes:  Reportedly seen on labs in the recent labs. Records not available to [place].     OP:  Diagnosed few yrs ago, she did not tolerate Fosamax and Actonel. In 2019 she had a Reclast infusion, but had side effects (flu like) symptoms for 4 months. She takes Vit D 1000 iu daily. Last DXA scan was 08/2017 in [place] and her T-score was -2.2 . Menopause was 2000 s/p TAH BSO (age 67). She has been on premarin since couple of  months (started by her PCP). She was not on HRT for last 20 yrs. Reports joint pains and thigh pains (L>R). She does have back pain and tenderness, since a fall from an accident about 8 yrs ago. She has lost 1 inch of height.       Past Medical History:   Diagnosis Date   . Graves disease    . Hypertension    . MI (myocardial infarction) (CMS [facility])     times 3   . Myasthenia gravis (CMS [facility])          Past Surgical History:   Procedure Laterality Date   . BREAST SURGERY      lump removed from left breast   . HX CHOLECYSTECTOMY     . HX HYSTERECTOMY           Social History     Socioeconomic History   . Marital status: Divorced     Spouse name: Not on file   . Number of children: Not on file   . Years of education: Not on file   . Highest education level: Not on file  Occupational History   . Not on file   Social Needs   . Financial resource strain: Not on file   . Food insecurity     Worry: Not on file     Inability: Not on file   . Transportation needs     Medical: Not on file     Non-medical: Not on file   Tobacco Use   . Smoking status: Never Smoker   . Smokeless tobacco: Never Used   Substance and Sexual Activity   . Alcohol use: Never     Frequency: Never     Binge frequency: Never   . Drug use: Never   . Sexual activity: Not on file   Lifestyle   . Physical activity     Days per week: Not on file     Minutes per session: Not on file   . Stress: Not on file   Relationships   . Social Wellsite geologist on phone: Not on file     Gets together: Not on file     Attends religious service: Not on file     Active member of club or organization: Not on file     Attends meetings of clubs or organizations: Not on file     Relationship status: Not on file   . Intimate partner violence     Fear of current or ex partner: Not on file     Emotionally abused: Not on file     Physically abused: Not on file     Forced sexual activity: Not on file   Other Topics Concern   . Not on file   Social History Narrative   . Not on  file     Family Medical History:     None            Allergies   Allergen Reactions   . Calcium Channel Blocking Agent Diltiazem Analogues    . Erythromycin    . Penicillins        .  ALPRAZolam (XANAX) 0.5 mg Oral Tablet, Take 0.5 mg by mouth Three times a day    .  atomoxetine (STRATTERA) 80 mg Oral Capsule, Take 80 mg by mouth Once a day    .  baclofen (LIORESAL) 10 mg Oral Tablet, Take 10 mg by mouth Three times a day    .  clopidogreL (PLAVIX) 75 mg Oral Tablet, Take 75 mg by mouth Once a day    .  conjugated estrogens (PREMARIN) 0.625 mg Oral Tablet, Take 0.625 mg by mouth Once a day    .  hydroCHLOROthiazide (HYDRODIURIL) 25 mg Oral Tablet, Take 25 mg by mouth Once a day    .  isosorbide mononitrate (IMDUR) 60 mg Oral Tablet Sustained Release 24 hr, 60 mg Take 1/2 tablet every evening.    .  latanoprost (XALATAN) 0.005 % Ophthalmic Drops, Instill 1 Drop into both eyes Every evening    .  levothyroxine (SYNTHROID) 150 mcg Oral Tablet, Take 150 mcg by mouth Every morning    .  nitroGLYCERIN (NITROSTAT) 0.4 mg Sublingual Tablet, Sublingual, 0.4 mg by Sublingual route Every 5 minutes as needed for Chest pain for 3 doses over 15 minutes    .  nortriptyline (PAMELOR) 50 mg Oral Capsule, Take 50 mg by mouth Every night    .  pantoprazole (PROTONIX) 40 mg Oral Tablet, Delayed Release (E.C.), Take 40 mg by mouth Once a day    .  predniSONE (DELTASONE) 20 mg Oral Tablet, 20 mg Take 2 tablets by mouth every other day and one every other day, alternating.    .  pyridostigmine bromide (MESTINON) 60 mg Oral Tablet, Take 60 mg by mouth Six times a day    .  ranolazine (RANEXA) 500 mg Oral Tablet Sustained Release 12 hr, Take 500 mg by mouth Twice daily    .  sucralfate (CARAFATE) 1 gram Oral Tablet, Take 1 g by mouth Four times a day - before meals and bedtime    .  tolterodine (DETROL LA) 4 mg Oral Capsule, Sust. Release 24 hr, Take 4 mg by mouth Once a day    .  traMADoL (ULTRAM) 50 mg Oral Tablet, Take by mouth Four  times a day    .  trimethoprim-sulfamethoxazole (BACTRIM DS) 160-800mg  per tablet, Take 1 Tab by mouth Every Monday, Wednesday and Friday    .  Valsartan (DIOVAN) 320 mg Oral Tablet, Take 320 mg by mouth Once a day    No facility-administered medications prior to visit.       The patient's past medical and surgical history, social history, family history, allergies and medications were reviewed and updated.      ROS: As is stated in the history of present illness.  See scanned form in medical chart from 03/01/2019. Confirmed and reviewed with the patient. Otherwise reviewed in full and other ROS is negative.  Also, see below:  Review of Systems -  Positives are marked with bold letters. Negatives are not bold.  See HPI  All other systems negative.    Physical Exam:  Vitals: BP 118/68   Pulse 87   Ht 1.499 m (4\' 11" )   Wt 80.1 kg (176 lb 9.6 oz)   BMI 35.67 kg/m     , Body mass index is 35.67 kg/m.,  Wt Readings from Last 3 Encounters:   03/01/19 80.1 kg (176 lb 9.6 oz)     Constitutional: well-developed, well-nourished, and in no distress.   Head: Normocephalic, Atrautmatic, No facial lesions observed. Round facies, some flushing. Supraclavicular fat pad, mild buffalo hump.   Mouth/Throat: Oropharynx is clear and moist  Eyes:Non-icteric. Mild proptosis,no stare noted. Extraocular movements intact, no ophthalmoplegia.  Neck: Normal range of motion. Neck supple. No thyromegaly present. No discrete nodularity identified.  Cardiovascular: regular rate and rhythm, no murmurs, intact distal pulses (radial, dorsalis pedis), no edema in all extremities.   Pulmonary/Chest: normal breath sounds, clear to auscultation bilaterally,no distress.   Abdominal: Soft, tender in RLQ and epigastric region, non-distended  Musculoskeletal: Normal range of motion, normal muscle mass, normal muscle tone. Mild weakness in UE. Spine tenderness. Unable to stand from seated position without use of arms.  Neurological: alert, not  disoriented, no focal deficits. Fine hand tremors with fingers extended  Skin: Bruising on arms   Psychiatric: Appropriate mood and affect. No irritability, no cognitive impairment.      Data             ADDENDUM 04/04/2013 01:25PM   Addendum: The right upper pole nodule measures 1 x 0.6 x 0.6 cm and is   solid and could serve as an index nodule for followup studies.   END OF ADDENDUM         INDICATION: Graves disease.    TECHNIQUE: Thyroid ultrasound.    FINDINGS: The isthmus measures 0.4 cm in AP diameter. The right lobe   measures 4.5 x 1.7 x 1.6 cm. The left lobe measures  4.8 x 1.4 x 1.7 cm.   The gland is heterogeneous in multinodular. An index solid hypoechoic   right upper pole nodule measures 1 x 0.6 5.6 cm. A cystic left midpole   nodule measures 0.4 x 0.3 x 0.3 cm. A solid isoechoic left upper pole   nodule measures 1.0 x 0.8 x 0.5 cm.        IMPRESSION: Normal-sized gland with heterogeneous echotexture and small   nodules as detailed.      05/14/2017  1:48 PM MST  DEXA SCAN    CLINICAL HISTORY:  Bone mineralization loss assessment.    FINDINGS:  DEXA imaging of the left hip and lumbar spine was performed.    The bone mass density of the left femoral neck is 0.635 g/cm^2  resulting in a T-score of -1.9.    The bone mass density from L1 through L4 is 0.810 g/cm^2  resulting in a T-score of -2.2.    IMPRESSION:  Loss of bone mineralization compatible with osteopenia.    FRAX SCORE:  The estimated 10-year risk for a hip fracture is 1.6%.  The estimated 10-year risk for a osteoporotic fracture is 13%.      Remainder of data reviewed in electronic chart.      Impression & Plan  HUDSYN BARICH is a 58 y.o.  female who is seen in clinic today for:    Graves' disease, now with post ablative hypothyroidism  -Continue LT4 150 mcg for now  -Check TSH, FT4, adjust dose as needed     Prediabetes  Likely 2/2 chronic steroid use.   -Will check A1C today and get records from her PCP's office     Osteopenia:   Last DXA  in 08/2017. Risk factors are chronic steroid use, early surgical menopause, ex-smoker, h/o hyperthyroidism.She is at high risk of fractures due to high risk of falls. She has poor vision and poor gait, does use a walker only when she is outside. She did not tolerate oral and IV BP. Last dose and only dose of Reclast was 08/2017.       -Will repeat DXA in 08/2019. Will discuss starting Prolia on next visit.   -Continue Vit D 1000 iu daily and Ca supplement.   -Continue use of walker to avoid falls.     Return in about 3 months (around 05/30/2019).      Darden Palmer, MD  Endocrinology  [address] [place]  [address]  [phone] (office)  [phone] (fax)  [email or web address]  Pager- [phone]

## 2019-03-02 ENCOUNTER — Other Ambulatory Visit (INDEPENDENT_AMBULATORY_CARE_PROVIDER_SITE_OTHER): Payer: Self-pay | Admitting: INTERNAL MEDICINE-ENDOCRINOLOGY-DIABETES AND METABOLISM

## 2019-03-02 MED ORDER — METFORMIN 500 MG TABLET
500.0000 mg | ORAL_TABLET | Freq: Two times a day (BID) | ORAL | 4 refills | Status: DC
Start: 2019-03-02 — End: 2021-08-30

## 2019-03-02 NOTE — Progress Notes (Signed)
Called patient to inform her the thyroid labs are ok, continue the same dose of LT4.  Her A1C is elevated and I recommend metformin. She wants to try the low dose. I will start 500 mg BID and have sent it to her pharmacy.       Gwenith Spitz, MD

## 2019-03-03 ENCOUNTER — Telehealth (INDEPENDENT_AMBULATORY_CARE_PROVIDER_SITE_OTHER): Payer: Self-pay | Admitting: INTERNAL MEDICINE-ENDOCRINOLOGY-DIABETES AND METABOLISM

## 2019-03-03 DIAGNOSIS — K551 Chronic vascular disorders of intestine: Secondary | ICD-10-CM

## 2019-03-03 NOTE — Telephone Encounter (Signed)
Patient called and states she has never taken the Strattera that is on her list. She requests for that to be removed and that she currently takes Atorvastatin and it was not on her list. She asked for it to be added. Changes to medication list made per patients request.

## 2019-03-08 ENCOUNTER — Encounter (INDEPENDENT_AMBULATORY_CARE_PROVIDER_SITE_OTHER): Payer: Self-pay | Admitting: Internal Medicine

## 2019-03-28 DIAGNOSIS — R1310 Dysphagia, unspecified: Secondary | ICD-10-CM

## 2019-05-31 ENCOUNTER — Encounter (INDEPENDENT_AMBULATORY_CARE_PROVIDER_SITE_OTHER): Payer: Self-pay | Admitting: INTERNAL MEDICINE-ENDOCRINOLOGY-DIABETES AND METABOLISM

## 2019-06-08 DIAGNOSIS — Z7952 Long term (current) use of systemic steroids: Secondary | ICD-10-CM

## 2019-06-08 DIAGNOSIS — K551 Chronic vascular disorders of intestine: Secondary | ICD-10-CM

## 2019-06-08 DIAGNOSIS — R109 Unspecified abdominal pain: Secondary | ICD-10-CM

## 2019-06-09 ENCOUNTER — Encounter (INDEPENDENT_AMBULATORY_CARE_PROVIDER_SITE_OTHER): Admitting: INTERNAL MEDICINE-ENDOCRINOLOGY-DIABETES AND METABOLISM

## 2019-06-20 ENCOUNTER — Encounter (INDEPENDENT_AMBULATORY_CARE_PROVIDER_SITE_OTHER): Admitting: INTERNAL MEDICINE-ENDOCRINOLOGY-DIABETES AND METABOLISM

## 2019-08-03 ENCOUNTER — Encounter (INDEPENDENT_AMBULATORY_CARE_PROVIDER_SITE_OTHER): Payer: Self-pay | Admitting: INTERNAL MEDICINE-ENDOCRINOLOGY-DIABETES AND METABOLISM

## 2021-03-21 ENCOUNTER — Ambulatory Visit (INDEPENDENT_AMBULATORY_CARE_PROVIDER_SITE_OTHER): Payer: Self-pay | Admitting: Ophthalmology

## 2021-03-26 ENCOUNTER — Ambulatory Visit (INDEPENDENT_AMBULATORY_CARE_PROVIDER_SITE_OTHER): Payer: Self-pay | Admitting: Ophthalmology

## 2021-03-26 NOTE — Telephone Encounter (Signed)
Message sent to MD for advisement. Myrtie Soman, RN  03/26/2021, 09:18

## 2021-03-26 NOTE — Telephone Encounter (Signed)
Returned call to patient to advise per provider:  Pierce Crane, MD  Myrtie Soman, RN; Nanci Pina, MD  Caller: Unspecified (Today, 9:10 AM)  Unfortunately, I can't really answer that until we see her and explore our options.     Best -     BAT   Patient verbalized understanding. No further questions or concerns at this time and is aware of appointment time and date scheduled. Myrtie Soman, RN  03/26/2021, 10:16

## 2021-03-26 NOTE — Telephone Encounter (Signed)
Regarding: Kathleen Rios  ----- Message from Crist Infante Raber sent at 03/26/2021  9:12 AM EST -----  Pt called and said she has some questions regarding the procedure. She wants to know how many treatments and how long or if there is a set guideline for it . What her Copay will be if there is one

## 2021-04-11 ENCOUNTER — Other Ambulatory Visit: Payer: Self-pay

## 2021-04-11 ENCOUNTER — Encounter (INDEPENDENT_AMBULATORY_CARE_PROVIDER_SITE_OTHER): Payer: Self-pay | Admitting: Ophthalmology

## 2021-04-11 ENCOUNTER — Ambulatory Visit (INDEPENDENT_AMBULATORY_CARE_PROVIDER_SITE_OTHER): Admitting: Ophthalmology

## 2021-04-11 DIAGNOSIS — E079 Disorder of thyroid, unspecified: Secondary | ICD-10-CM

## 2021-04-11 DIAGNOSIS — G7000 Myasthenia gravis without (acute) exacerbation: Secondary | ICD-10-CM

## 2021-04-11 DIAGNOSIS — H5789 Other specified disorders of eye and adnexa: Secondary | ICD-10-CM

## 2021-04-11 NOTE — Progress Notes (Signed)
MD Addition to HPI:     History source: patient    Patient here for evaluation of TED.     She was diagnosed with Graves' disease in 2014. She was tried on methimazole and had an allergic reaction. She did RAI about 4-5 times total, most recently in 2018.    She was also diagnosed with ocular MG, for which she is not any treatment. She was on pyridostigmine at one point. She has been seronegative, but is awaiting an EMG. She previously had a good response to treatment with Mestinon.     She notes she has double vision which gets worse through the day. She notes it is sometimes vertical, and sometimes horizontal.     She feels like her eyes started changing as well around 2018. She has been having double vision which has gotten worse over that period of time. She notes pain with eye movement, but nauseas and dizzy. She does feel like she has weakness in her arms and legs. She has been high dose prednisone for her MG for several years. Currently off. Has been using Systane and Genteal for lubrication.     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 We discussed the possible time courses of the thyroid disease and the eye disease, and the potential for presentations being separated by multiple years. We discussed the importance of abstinence from both first and secondhand tobacco exposure as this worsens the course of eye disease, and makes long-term control of the disease difficult and unpredictable. We discussed the range of ophthalmic findings which may be present in thyroid eye disease ranging from mild dryness of the eyes, to significant lid retraction and/or proptosis, to vision-threatening  compressive optic neuropathy. We discussed the range of treatment options as they relate to disease status and ophthalmic status. We discussed the value of oral steroids being present only in the setting of RAI treatment with a history of orbitopathy or smoking, and the use of steroids markedly reducing the risk of worsening orbitopathy in such patients. We discussed the use of other oral medications such as Selenium and diclofenac for mild to moderate levels of orbital inflammation. We discussed the use of IV steroids versus teprotumumab if orbital inflammation became severe, or vision threatened. We briefly discussed the various surgical options and the usual path down surgical rehabilitation that is needed depending on the patient's needs.       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 She does not currently meet CAS requirements for teprotumumab authorization. She also has poor mobility due to chronic high steroid use and is a poor candidate for managing common side effects of treatment. Her response in terms of proptosis and diplopia is likely to be inadequate/disappointing for her current needs.    Her double vision is not very consistent with TED. I agree with EMG testing to verify presence/absence of OMG.     Prescription/Drug Management: None    Treatment Plan discussed with/Letter prepared for: Referring Provider  Follow up:    I have asked MYRICAL MARTELLO to follow up in 3-4 months           I have seen and examined the above patient. I discussed the above diagnoses listed in the assessment and the above ophthalmic plan of care with the patient and patient's family. All questions were answered. I reviewed and, when necessary, made changes to the technician/resident note, documented ophthalmology exam, chief complaint, history of present illness, allergies, review of systems, past medical, past surgical, family and social history.    No orders of the defined types were placed in this  encounter.      No orders of the defined types were placed in this encounter.      Blase Mess, MD  04/11/2021, 15:40    Base Eye Exam     Visual Acuity (Snellen - Linear)       Right Left    Dist cc 20/25 20/30 -1    Dist ph cc 20/20 -1 20/25    Correction: Glasses          Tonometry (Tonopen, 3:59 PM)       Right Left    Pressure 15 15          Pupils       Pupils APD    Right PERRL None    Left PERRL None          Visual Fields (Counting fingers)       Right Left     Full Full          Extraocular Movement       Right Left     Full, Ortho Full, Ortho   Small angle XT  Inconsistent monocular diplopia in upgaze OS           Neuro/Psych     Oriented x3: Yes    Mood/Affect: Normal            Slit Lamp and Fundus Exam     External Exam       Right Left    External Proptosis Proptosis    Palpebral fissure 10 mm 10 mm    MRD1 5 mm 5 mm    Superior scleral show 0 mm 0 mm    Inferior scleral show 0.5 mm 0.5 mm    Lagophthalmos 2 mm 3 mm          Exophthalmometry (Base: 115 mm)       Right Left    Hertel 25 mm 25 mm          Slit Lamp Exam       Right Left    Lids/Lashes Scleral show, Lid retraction Scleral show, Lid retraction    Conjunctiva/Sclera White and quiet White and quiet    Cornea Clear Clear    Anterior Chamber Deep and quiet Deep and quiet    Iris Round and reactive Round and reactive    Lens Nuclear sclerosis Nuclear sclerosis    Vitreous Normal Normal              Exophthalmometry (Base: 115 mm)       Right Left    Hertel 25 mm 25 mm      External       Right Left    Palpebral fissure 10 mm 10 mm    MRD1 5 mm 5 mm    Superior scleral show 0 mm 0 mm    Inferior scleral show  0.5 mm 0.5 mm    Lagophthalmos 2 mm 3 mm

## 2021-04-12 ENCOUNTER — Encounter (INDEPENDENT_AMBULATORY_CARE_PROVIDER_SITE_OTHER): Payer: Self-pay | Admitting: Ophthalmology

## 2021-06-22 DIAGNOSIS — A419 Sepsis, unspecified organism: Secondary | ICD-10-CM

## 2021-06-22 DIAGNOSIS — I214 Non-ST elevation (NSTEMI) myocardial infarction: Secondary | ICD-10-CM

## 2021-06-22 DIAGNOSIS — I2510 Atherosclerotic heart disease of native coronary artery without angina pectoris: Secondary | ICD-10-CM

## 2021-07-11 ENCOUNTER — Encounter (INDEPENDENT_AMBULATORY_CARE_PROVIDER_SITE_OTHER): Payer: Self-pay | Admitting: Ophthalmology

## 2021-08-30 ENCOUNTER — Other Ambulatory Visit: Payer: Self-pay

## 2021-08-30 ENCOUNTER — Encounter (INDEPENDENT_AMBULATORY_CARE_PROVIDER_SITE_OTHER): Payer: Self-pay | Admitting: Family

## 2021-08-30 ENCOUNTER — Ambulatory Visit (INDEPENDENT_AMBULATORY_CARE_PROVIDER_SITE_OTHER): Payer: 59 | Admitting: Family

## 2021-08-30 VITALS — BP 139/86 | HR 69 | Temp 95.8°F | Resp 16 | Ht 59.0 in | Wt 102.0 lb

## 2021-08-30 DIAGNOSIS — R778 Other specified abnormalities of plasma proteins: Secondary | ICD-10-CM

## 2021-08-30 DIAGNOSIS — R002 Palpitations: Secondary | ICD-10-CM

## 2021-08-30 DIAGNOSIS — K551 Chronic vascular disorders of intestine: Secondary | ICD-10-CM | POA: Insufficient documentation

## 2021-08-30 DIAGNOSIS — R0609 Other forms of dyspnea: Secondary | ICD-10-CM

## 2021-08-30 DIAGNOSIS — G7000 Myasthenia gravis without (acute) exacerbation: Secondary | ICD-10-CM

## 2021-08-30 DIAGNOSIS — R000 Tachycardia, unspecified: Secondary | ICD-10-CM

## 2021-08-30 DIAGNOSIS — I10 Essential (primary) hypertension: Secondary | ICD-10-CM

## 2021-08-30 NOTE — Progress Notes (Signed)
Cardiology [facility], [facility]  [address]  [place] [address]  [phone]    Cardiology  Clinic Note    Name: Kathleen Rios   DOB: [birth year]  [61 y.o. female]   MRN: [id number]       Visit Date: 08/30/2021   Referring: No referring provider defined for this encounter.   PCP: Matilde Haymaker, MD         Chief Complaint: Hospital Follow Up (In Hospital at [facility] for Heart attack.  She states BP is "wacky"  mjs)      History of Present Illness   Kathleen Rios is a 61 y.o. White female who presents for hospital follow up. PMH includes MI, COPD, myasthenia gravis, SMA stenosis.     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 Stress test at that time showed no evidence of pharmacologically induced ischemia or previous myocardial infarction, EF 53%    She was admitted to [facility] in March 2023 for chest pain, abdominal pain, and back pain with N/V. Vascular evaluated her for Mesenteric ischemia; no acute intervention needed at that time. Troponin elevation noted, Dr Darron Doom evaluated her for Dr Kirk Ruths, and felt her trop was not related to ACS, but secondary to sepsis. Troponin peaked at 1292, then trended down. He recommended outpatient follow up.     S/p stent to SMA with Dr Nedra Hai in May 2023    She tells me she has been having increased palpations, at least daily for the last 2 weeks. Sometimes these episodes last 2 hours. She does feel dizzy with the elevated HR/palpitations. She does note dyspnea with exertion. Denies chest pain.     She follows with Dr Theodoro Grist re: myasthenia gravis        Patient Active Problem List    Diagnosis Date Noted   . Essential hypertension 08/30/2021   . Myasthenia gravis (CMS HCC) 08/30/2021       Allergies  Allergies   Allergen Reactions   . Calcium Channel  Blocking Agent Diltiazem Analogues    . Erythromycin    . Penicillins        Medications    Current Outpatient Medications:   .  ALPRAZolam (XANAX) 0.5 mg Oral Tablet, Take 1 Tablet (0.5 mg total) by mouth Three times a day, Disp: , Rfl:   .  baclofen (LIORESAL) 10 mg Oral Tablet, Take 1 Tablet (10 mg total) by mouth Three times a day, Disp: , Rfl:   .  clopidogreL (PLAVIX) 75 mg Oral Tablet, Take 1 Tablet (75 mg total) by mouth Once a day, Disp: , Rfl:   .  ergocalciferol, vitamin D2, (VITAMIN D) 1,250 mcg (50,000 unit) Oral Capsule, Take 1 Capsule (50,000 Units total) by mouth Every 7 days, Disp: , Rfl:   .  glucosamine/chondr su A sod (GLUCOSAMINE-CHONDROITIN) 167-133 mg Oral Capsule, Take 1 Capsule by mouth, Disp: , Rfl:   .  isosorbide mononitrate (IMDUR) 60 mg Oral Tablet Sustained Release 24 hr, Twice daily Take 1/2 tablet every evening., Disp: , Rfl:   .  levothyroxine (SYNTHROID) 150 mcg Oral Tablet, Take 125 mcg by mouth Every morning, Disp: , Rfl:   .  metoprolol tartrate (LOPRESSOR) 25 mg Oral Tablet, Take 1 Tablet (25 mg total) by mouth  Twice daily, Disp: 180 Tablet, Rfl: 0  .  nitroGLYCERIN (NITROSTAT) 0.4 mg Sublingual Tablet, Sublingual, Place 1 Tablet (0.4 mg total) under the tongue Every 5 minutes as needed for Chest pain for 3 doses over 15 minutes, Disp: , Rfl:   .  ondansetron (ZOFRAN) 4 mg Oral Tablet, Take 1 Tablet (4 mg total) by mouth Three times a day as needed, Disp: , Rfl:   .  pantoprazole (PROTONIX) 40 mg Oral Tablet, Delayed Release (E.C.), Take 1 Tablet (40 mg total) by mouth Once a day, Disp: , Rfl:   .  ranolazine (RANEXA) 500 mg Oral Tablet Sustained Release 12 hr, Take 1 Tablet (500 mg total) by mouth Twice daily, Disp: , Rfl:   .  sucralfate (CARAFATE) 1 gram Oral Tablet, Take 1 Tablet (1 g total) by mouth Four times a day - before meals and bedtime, Disp: , Rfl:   .  traMADoL (ULTRAM) 50 mg Oral Tablet, Take by mouth Four times a day, Disp: , Rfl:   .  Valsartan (DIOVAN) 320 mg  Oral Tablet, Take 0.5 Tablets (160 mg total) by mouth Twice daily, Disp: , Rfl:     History  Past Medical History:   Diagnosis Date   . Dyslipidemia    . Graves disease    . History of stress test    . Hypertension    . MI (myocardial infarction) (CMS HCC)     times 3   . Myasthenia gravis (CMS [facility])          Past Surgical History:   Procedure Laterality Date   . BREAST SURGERY      lump removed from left breast   . CARDIAC CATHETERIZATION     . HX CHOLECYSTECTOMY     . HX HYSTERECTOMY       Social History     Socioeconomic History   . Marital status: Divorced   Tobacco Use   . Smoking status: Former     Types: Cigarettes   . Smokeless tobacco: Never   Vaping Use   . Vaping Use: Never used   Substance and Sexual Activity   . Alcohol use: Yes     Comment: occasional   . Drug use: Never     Family Medical History:     Family history is unknown by patient.            Review of Systems:  General: Denies fever or chills. No recent weight change.  CNS: Denies syncope or dizziness. No headache.  Eyes: Denies recent visual changes. No pain.  Ears, Nose, and Throat: Denies ear pain, nasal congestion, or sore throat.  Endocrine: Denies excessive hunger or thirst. No heat/cold intolerance.  Respiratory: + shortness of breath. No cough.  Cardiovascular: Per HPI Denies claudication or varicose veins.  GI: Denies nausea/vomiting. No black/bloody bowel movements.  GU: Denies hematuria or dysuria. No incontinence.  Musculoskeletal: Denies pain, joint swelling, or decreased ROM.  Skin: No rashes, hives, or eczema.  Psychiatric: Denies anxiety, depression, or insomnia.  Hematologic: No bleedings disorders or swollen nodes.    All other systems reviewed and either negative or not pertinent.     Physical Examination:  BP 139/86 (Site: Left, Patient Position: Sitting, Cuff Size: Child)   Pulse 69   Temp 35.4 C (95.8 F) (Temporal)   Resp 16   Ht 1.499 m (4\' 11" )   Wt 46.3 kg (102 lb)   SpO2 99%   BMI 20.60 kg/m  General:  Awake. Thin. No acute distress. A&O x 3.  HEENT: Normocephalic, atraumatic. PERRL. Normal hearing.  Neck: No JVD or bruit. Trachea midline.  Lungs: Clear to auscultation. No rhonchi or wheezing noted. Unlabored.  Heart: Regular rate and rhythm. No murmurs, rubs, or gallops are appreciated.  Abdomen: BS normal x 4 quadrants. Soft, non-tender, non-distended.  Extremities: No clubbing, cyanosis, or edema.  Psychiatric: Cooperative, appropriate mood and affect.  Musculoskeletal: Strength grips are equal. No red, swollen joints. MAE.  Skin: Warm, dry, and intact. No rashes or hives are noted.     Diagnostics  ECG:  To be reviewed by Dr Kirk Ruths    Echo:  March 2023  Summary:  1.Leftventricle:Thecavitysizeisnormal.Wallthicknessismildlyincreased.Systolicfunctionisnormal.Theestimated ejectionfractionis5055% Regionalwallmotionabnormalitiescannotbeexcluded.  2.Ventricularseptum:Thicknessismildlyincreased.  3.Mitralvalve:Thereismoderateregurgitation.  4.Tricuspidvalve:Thereismildregurgitation.  5.Atrivialpericardialeffusioncannotbeexcluded.      Catherization:  LHC by Dr Kirk Ruths in June 2020  CORONARIES:  Right coronary: The right coronary artery is a dominant artery giving rise to PDA and posterolateral branch. The right coronary artery, PDA, and posterolateral branch have 0-5% diffuse disease.  Left main coronary: The left main has 0-5% diffuse disease..  Left anterior descending coronary: The LAD is a moderate caliber artery giving rise to multiple diagonals. LAD has 5-10% diffuse disease and diagonal 1 has 80% mid disease, this vessel appears less than 2mm in diameter (essentially unchanged from January,2020).  Circumflex coronary: The circumflex gives rise to 2 OMs. Circumflex proper, OM1, and OM2 have 5-10% diffuse disease..  CONCLUSIONS:  1. 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 No significant aortic  stenosis on pullback gradient.          Orders Placed This Encounter   . 14 DAY EXTENDED HOLTER MONITOR   . MYOCARDIAL PERFUSION COMPLETE       Medications Discontinued During This Encounter   Medication Reason   . atorvastatin (LIPITOR) 80 mg Oral Tablet Patient states no longer taking   . conjugated estrogens (PREMARIN) 0.625 mg Oral Tablet Patient states no longer taking   . hydroCHLOROthiazide (HYDRODIURIL) 25 mg Oral Tablet Patient states no longer taking   . latanoprost (XALATAN) 0.005 % Ophthalmic Drops Patient states no longer taking   . metFORMIN (GLUCOPHAGE) 500 mg Oral Tablet Patient states no longer taking   . nortriptyline (PAMELOR) 50 mg Oral Capsule Patient states no longer taking   . predniSONE (DELTASONE) 20 mg Oral Tablet Patient states no longer taking   . pyridostigmine bromide (MESTINON) 60 mg Oral Tablet Patient states no longer taking   . tolterodine (DETROL LA) 4 mg Oral Capsule, Sust. Release 24 hr Patient states no longer taking   . trimethoprim-sulfamethoxazole (BACTRIM DS) 160-800mg  per tablet Patient states no longer taking       Assessment and Plan:  Assessment/Plan   1. Elevated troponin level not due to acute coronary syndrome    2. Tachycardia    3. Palpitations    4. DOE (dyspnea on exertion)    5. Essential hypertension    6. Myasthenia gravis (CMS [facility])        PLAN: admitted to [facility] in March 2023 for chest pain, abdominal pain, and back pain with N/V. Vascular evaluated her for Mesenteric ischemia; no acute intervention needed at that time. Troponin peaked at 1292, Dr Darron Doom evaluated her for Dr Kirk Ruths, and felt her trop was not related to ACS, but secondary to sepsis. S/p stent to SMA with Dr Nedra Hai in May 2023. Reports increased palpations, at least daily  for the last 2 weeks. Sometimes these episodes last 2 hours. She does feel dizzy with the elevated HR/palpitations. She does note dyspnea with exertion. Denies chest pain. Arrange stress test. Place monitor. Recent echo in  March 2023 showed EF 50-55%, moderate MR, mild TR      Follow up:  Return in about 1 year (around 08/31/2022).        Jori MollJessica Malayiah Mcbrayer, APRN,NP-C  [facility]  Cardiology  [facility]    A portion of this documentation may have been generated using [facility] voice recognition software and may contain syntax/voice recognition errors.

## 2021-09-07 ENCOUNTER — Other Ambulatory Visit: Payer: Self-pay

## 2021-09-09 ENCOUNTER — Encounter (INDEPENDENT_AMBULATORY_CARE_PROVIDER_SITE_OTHER): Payer: Self-pay | Admitting: Family

## 2021-09-11 ENCOUNTER — Encounter (INDEPENDENT_AMBULATORY_CARE_PROVIDER_SITE_OTHER): Payer: Self-pay | Admitting: Family

## 2021-10-05 ENCOUNTER — Encounter: Payer: Self-pay | Admitting: Internal Medicine

## 2021-10-21 DIAGNOSIS — R001 Bradycardia, unspecified: Secondary | ICD-10-CM

## 2021-10-23 ENCOUNTER — Telehealth (INDEPENDENT_AMBULATORY_CARE_PROVIDER_SITE_OTHER): Payer: Self-pay | Admitting: Family

## 2021-10-23 DIAGNOSIS — R0609 Other forms of dyspnea: Secondary | ICD-10-CM

## 2021-10-23 DIAGNOSIS — R778 Other specified abnormalities of plasma proteins: Secondary | ICD-10-CM

## 2021-10-23 DIAGNOSIS — I208 Other forms of angina pectoris: Secondary | ICD-10-CM

## 2021-10-23 DIAGNOSIS — R9439 Abnormal result of other cardiovascular function study: Secondary | ICD-10-CM

## 2021-10-23 NOTE — Telephone Encounter (Signed)
Spoke with patient re: stress test results and monitor results. Recommend CCTA. She verbalized understanding and is agreeable to proceed. Will arrange CCTA. Monitor showed bradycardia.       Stress Test July 2023  CONCLUSION  1.  Abnormal myocardial perfusion study with evidence of a nontransmural septal myocardial infarction without significant ischemia, a large       inferoapical myocardial infarction without significant peri-infarction ischemia.  2.  Preserved LV ejection fraction of 64% with what appears to be findings suggestive of injury to the inferior wall and apex and, to a lesser       degree, septum.  3.  Stress portion per Dr. Silverio Lay with no evidence by EKG or symptomatology for pharmacologically-induced ischemia.  The patient did       experience abdominal discomfort requiring aminophylline for resolution without recurrence.  She did not have any chest discomfort.

## 2021-11-04 ENCOUNTER — Encounter (INDEPENDENT_AMBULATORY_CARE_PROVIDER_SITE_OTHER): Payer: Self-pay | Admitting: Family

## 2021-11-05 ENCOUNTER — Other Ambulatory Visit (INDEPENDENT_AMBULATORY_CARE_PROVIDER_SITE_OTHER): Payer: Self-pay | Admitting: Family

## 2021-11-05 ENCOUNTER — Encounter (INDEPENDENT_AMBULATORY_CARE_PROVIDER_SITE_OTHER): Payer: Self-pay

## 2021-11-05 NOTE — Nursing Note (Signed)
Ok per Brayton Caves, I called into [facility]  Prednisone 50 mg to be taken 13 hours, 7 hours, and 1 hour before the ccta.  And also benedryl 50 mg needs to be taken 1 hour prior to the ccta.  I advised pt to have a driver for the ccta. H

## 2021-11-07 ENCOUNTER — Telehealth (INDEPENDENT_AMBULATORY_CARE_PROVIDER_SITE_OTHER): Payer: Self-pay | Admitting: Family

## 2021-11-07 DIAGNOSIS — T887XXA Unspecified adverse effect of drug or medicament, initial encounter: Secondary | ICD-10-CM

## 2021-11-07 MED ORDER — ROSUVASTATIN 10 MG TABLET
10.0000 mg | ORAL_TABLET | Freq: Every evening | ORAL | 5 refills | Status: AC
Start: 2021-11-07 — End: ?

## 2021-11-07 NOTE — Telephone Encounter (Signed)
Spoke with patient re: CCTA results. At this time, we would recommend an invasive coronary evaluation. I discussed the risks and benefits with the patient, including but not limited to: damage to an artery/vein or nerve, bleeding, infection, allergic reaction to the dye, kidney damage, MI, stroke, restenosis, radiation exposure, risks of conscious sedation, emergent bypass, and death. All questions have been answered to the patient's apparent satisfaction and they wish to proceed in this manner with Dr Kirk Ruths.     She was previously intolerant of Lipitor due to leg pain, but is willing to try Crestor. Will send RX. Mail lab slip for lfts in 1 month       CCTA Aug 2023  FINDINGS:     RCA: Calcified plaques causing mild stenoses.  Right coronary artery is  dominant.     LEFT MAIN CORONARY ARTERY: Calcified plaque along the distal aspect causes  moderate stenosis.     LAD: Calcified plaques along the proximal and mid portions caused moderate to  severe stenoses.     LCx: Calcified plaques cause up to moderate stenoses.     NONCORONARY CARDIAC FINDINGS:     The heart is normal in size. There are no findings of intracardiac thrombus.     NONCARDIAC FINDINGS:     Foci of scarring and/or atelectasis are noted in the lungs.  Degenerative  osseous changes are present.  ___________________________________  IMPRESSION:  Calcified plaque in the left main coronary artery causes moderate stenosis.  Calcified plaque in the left anterior descending coronary artery causes moderate  to severe stenosis.  Calcified plaque in the left circumflex artery causes  moderate stenosis.  Calcified plaque in the right coronary artery causes mild  stenosis.

## 2021-11-13 ENCOUNTER — Encounter (INDEPENDENT_AMBULATORY_CARE_PROVIDER_SITE_OTHER): Payer: Self-pay | Admitting: INTERVENTIONAL CARDIOLOGY

## 2021-11-14 ENCOUNTER — Other Ambulatory Visit (INDEPENDENT_AMBULATORY_CARE_PROVIDER_SITE_OTHER): Payer: Self-pay | Admitting: Family

## 2021-11-14 NOTE — Telephone Encounter (Signed)
Jessicas patient needs a refill on her Ranolazine 500 mg twice a day 30 day supply. She gets her refills at [facility] [phone] her # is [phone] Thanks

## 2021-11-15 MED ORDER — RANOLAZINE ER 500 MG TABLET,EXTENDED RELEASE,12 HR
500.0000 mg | ORAL_TABLET | Freq: Two times a day (BID) | ORAL | 5 refills | Status: AC
Start: 2021-11-15 — End: ?

## 2021-11-27 DIAGNOSIS — E039 Hypothyroidism, unspecified: Secondary | ICD-10-CM

## 2021-11-27 DIAGNOSIS — R519 Headache, unspecified: Secondary | ICD-10-CM

## 2021-11-27 DIAGNOSIS — R079 Chest pain, unspecified: Secondary | ICD-10-CM

## 2021-11-27 DIAGNOSIS — G7000 Myasthenia gravis without (acute) exacerbation: Secondary | ICD-10-CM

## 2021-11-27 DIAGNOSIS — I160 Hypertensive urgency: Secondary | ICD-10-CM

## 2021-11-27 DIAGNOSIS — K551 Chronic vascular disorders of intestine: Secondary | ICD-10-CM

## 2021-11-27 DIAGNOSIS — K219 Gastro-esophageal reflux disease without esophagitis: Secondary | ICD-10-CM

## 2021-11-28 DIAGNOSIS — I2510 Atherosclerotic heart disease of native coronary artery without angina pectoris: Secondary | ICD-10-CM

## 2022-08-06 ENCOUNTER — Encounter (INDEPENDENT_AMBULATORY_CARE_PROVIDER_SITE_OTHER): Payer: Self-pay | Admitting: VASCULAR SURGERY

## 2022-08-06 ENCOUNTER — Encounter (HOSPITAL_BASED_OUTPATIENT_CLINIC_OR_DEPARTMENT_OTHER): Payer: Self-pay | Admitting: VASCULAR SURGERY

## 2022-08-06 ENCOUNTER — Other Ambulatory Visit: Payer: Self-pay

## 2022-08-06 ENCOUNTER — Ambulatory Visit: Payer: 59 | Attending: PHYSICIAN ASSISTANT | Admitting: VASCULAR SURGERY

## 2022-08-06 VITALS — BP 122/72 | HR 60 | Ht 59.06 in | Wt 115.0 lb

## 2022-08-06 DIAGNOSIS — I6523 Occlusion and stenosis of bilateral carotid arteries: Secondary | ICD-10-CM | POA: Insufficient documentation

## 2022-08-06 DIAGNOSIS — K551 Chronic vascular disorders of intestine: Secondary | ICD-10-CM | POA: Insufficient documentation

## 2022-08-06 NOTE — Progress Notes (Signed)
Vascular Surgery  New Patient Clinic Note                    Date: 08/06/2022  Patient: Kathleen Rios   MRN: [id number]   DOB: 17-Mar-1961   PCP: Matilde Haymaker, MD    Chief Complaint:   Stenosis (Pt is here for mesenteric artery stenosis visit )      HPI: Kathleen Rios is a 62 y.o.  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 Orvan Falconer.  She is a previous patient of Dr. Orvan Falconer from [facility].  She has history of superior mesenteric artery stenting 08/13/21.  She had recent duplex testing at [facility].  She presents to establish care at [facility].  She states she is good and bad days in regards to eating.  She states she does not tend to eat much due to pain and decreased appetite as well as stress.  States her husband was recently diagnosed with pancreatic cancer.  She states mostly she develops belly pain with overeating.  She does have known history of gastroparesis.  She remains on aspirin, Plavix and Crestor.  She denies unilateral weakness, slurred speech, facial droop or amaurosis fugax.  She denies further complaints.    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[facility])     Carotid stenosis     Chronic nausea     Chronic pain     Chronic prescription opiate use     Combined form of age-related cataract, both eyes     COPD (chronic obstructive pulmonary disease) (CMS HCC)     Dry eye syndrome     Dyslipidemia     Gastroparesis     GERD (gastroesophageal reflux disease)     Graves disease     Graves' eye disease     History of stress test     Hypertension     MI (myocardial infarction) (CMS HCC)     times 3    Mood disorder (CMS HCC)     Myasthenia gravis (CMS HCC)     Osteoarthritis     Proximal limb weakness     Ramsay Hunt syndrome (geniculate herpes zoster)     Small vessel coronary artery disease     Thyroid eye disease          Social History     Tobacco Use     Smoking status: Former     Types: Cigarettes    Smokeless tobacco: Never   Vaping Use    Vaping status: Never Used   Substance Use Topics    Alcohol use: Not Currently     Comment: occasional    Drug use: Never      Family Medical History:       Problem Relation (Age of Onset)    Elevated Lipids Mother, Father    Heart Disease Mother    Hypertension (High Blood Pressure) Father    Kidney Disease Mother               ROS:  Pertinent positives and negatives as stated in HPI    Allergies   Allergen Reactions    Beta-Blockers (Beta-Adrenergic Blocking Agts)     Calcium Channel Blocking Agent Diltiazem Analogues     Codeine     Contrast Dye [Iv Contrast]     Erythromycin  Latex     Metformin     Methimazole     Penicillins        Current Outpatient Medications   Medication Sig    aspirin (ECOTRIN) 81 mg Oral Tablet, Delayed Release (E.C.) Take 1 Tablet (81 mg total) by mouth Once a day    clopidogreL (PLAVIX) 75 mg Oral Tablet Take 1 Tablet (75 mg total) by mouth Once a day    ergocalciferol, vitamin D2, (VITAMIN D) 1,250 mcg (50,000 unit) Oral Capsule Take 1 Capsule (50,000 Units total) by mouth Every 7 days    glucosamine/chondr su A sod (GLUCOSAMINE-CHONDROITIN) 167-133 mg Oral Capsule Take 1 Capsule by mouth    isosorbide mononitrate (IMDUR) 60 mg Oral Tablet Sustained Release 24 hr Twice daily Take 1/2 tablet every evening.    levothyroxine (SYNTHROID) 100 mcg Oral Tablet Take 1 Tablet (100 mcg total) by mouth Every morning    metoprolol tartrate (LOPRESSOR) 25 mg Oral Tablet Take 1 Tablet (25 mg total) by mouth Twice daily    nitroGLYCERIN (NITROSTAT) 0.4 mg Sublingual Tablet, Sublingual Place 1 Tablet (0.4 mg total) under the tongue Every 5 minutes as needed for Chest pain for 3 doses over 15 minutes    ondansetron (ZOFRAN) 4 mg Oral Tablet Take 1 Tablet (4 mg total) by mouth Three times a day as needed    pantoprazole (PROTONIX) 40 mg Oral Tablet, Delayed Release (E.C.) Take 1 Tablet (40 mg total) by mouth  Once a day    pregabalin (LYRICA) 50 mg Oral Capsule     ranolazine (RANEXA) 500 mg Oral Tablet Sustained Release 12 hr Take 1 Tablet (500 mg total) by mouth Twice daily    rosuvastatin (CRESTOR) 10 mg Oral Tablet Take 1 Tablet (10 mg total) by mouth Every evening    sucralfate (CARAFATE) 1 gram Oral Tablet Take 1 Tablet (1 g total) by mouth Four times a day - before meals and bedtime    traMADoL (ULTRAM) 50 mg Oral Tablet Take by mouth Four times a day       Physical Exam:   Vitals:    08/06/22 0955 08/06/22 0958   BP: 120/71 122/72   Pulse: 63 60   SpO2: 97%    Weight: 52.2 kg (115 lb)    Height: 1.5 m (4' 11.06")    BMI: 23.23       Constitutional: AA&O X3 Well developed and well nourished in no acute distress   Neck: Normal ROM, Supple, symmetrical, No bruits noted  Respiratory: Effort normal, clear to auscultation bilaterally.   Cardiovascular: Heart regular rate and rhythm..   Abdomen: Non-tender, No pulsating abdominal mass or bruit,       Assessment and Plan    ICD-10-CM    1. Mesenteric artery stenosis (CMS HCC)  K55.1    - patient's symptoms likely related to gastroparesis  - reviewed outlying facility duplex that shows patent SMA stent  - continue aspirin Plavix and Crestor  - follow up in 1 year with repeat duplex or sooner if needed   2. Bilateral carotid artery stenosis  I65.23    - asymptomatic  - reviewed outlying facility duplex that shows very mild stenosis of the right ICA and mild stenosis of the left ICA  - continue aspirin Plavix and Crestor  - Call 911 or report to the ED with unilateral weakness, slurred speech, facial droop, amaurosis fugax.  - follow up in 1 year with repeat duplex or sooner if needed  We will plan to continue surveillance and we will see the patient back. Return in about 1 year (around 08/06/2023).     Patient was given the opportunity to ask questions and those questions were answered to their satisfaction. Instructed to call with any further questions or  concerns.  Patient was seen and evaluated with Dr. Lynann Beaver, PA-C    Vascular Attending:  Patient seen and evaluated as part of a shared service with an APP.  Patient overall doing well with no recurrent symptoms of CMI.  Plan will ber to follow-up in one year with a repeat carotid and mesenteric duplex, we will see her again sooner if needed.    Beverely Low, MD      This note was partially generated using MModal Fluency Direct system, and there may be some incorrect words, spellings, and punctuation that were not noted in checking the note before saving, though effort was made to avoid such errors.

## 2022-09-24 ENCOUNTER — Encounter (INDEPENDENT_AMBULATORY_CARE_PROVIDER_SITE_OTHER): Payer: Self-pay | Admitting: Family

## 2022-09-24 ENCOUNTER — Ambulatory Visit: Payer: Medicare (Managed Care) | Attending: Family | Admitting: Family

## 2022-09-24 ENCOUNTER — Other Ambulatory Visit: Payer: Self-pay

## 2022-09-24 VITALS — BP 102/58 | HR 72 | Temp 96.4°F | Resp 16 | Ht 59.0 in | Wt 118.3 lb

## 2022-09-24 DIAGNOSIS — I340 Nonrheumatic mitral (valve) insufficiency: Secondary | ICD-10-CM | POA: Insufficient documentation

## 2022-09-24 DIAGNOSIS — Z87891 Personal history of nicotine dependence: Secondary | ICD-10-CM

## 2022-09-24 DIAGNOSIS — I2510 Atherosclerotic heart disease of native coronary artery without angina pectoris: Secondary | ICD-10-CM | POA: Insufficient documentation

## 2022-09-24 DIAGNOSIS — R002 Palpitations: Secondary | ICD-10-CM | POA: Insufficient documentation

## 2022-09-24 DIAGNOSIS — I10 Essential (primary) hypertension: Secondary | ICD-10-CM | POA: Insufficient documentation

## 2022-09-24 NOTE — Progress Notes (Signed)
Cardiology [facility], Medical Office Building [place]  [address]  [place] [address]  [phone]    Cardiology  Clinic Note    Name: Kathleen Rios   DOB: [birth year]  [62 y.o. female]   MRN: [id number]       Visit Date: 09/24/2022   Referring: No referring provider defined for this encounter.   PCP: Matilde Haymaker, MD         Chief Complaint: Hospital Follow Up (Pt denies any cp she does have flutters once in while. KH )      History of Present Illness   Kathleen Rios is a 62 y.o. White female who presents for hospital follow up. 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 Stress test at that time showed no evidence of pharmacologically induced ischemia or previous myocardial infarction, EF 53%     She was admitted to [facility] in March 2023 for chest pain, abdominal pain, and back pain with N/V. Vascular evaluated her for Mesenteric ischemia; no acute intervention needed at that time. Troponin elevation noted, Dr Darron Doom evaluated her for Dr Kirk Ruths, and felt her trop was not related to ACS, but secondary to sepsis. Troponin peaked at 1292, then trended down. Echo during that admission showed good EF, moderate MR. He recommended outpatient follow up.      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 Sometimes those episodes lasted 2 hours. She felt dizzy with the elevated HR/palpitations. She noted dyspnea with exertion. Denied chest pain. Monitor and stress test were ordered.     Monitor in June/July 2023 showed SB, no critical alerts.     Stress test in July 2023 showed  evidence of a nontransmural  septal myocardial infarction without significant ischemia, a large inferoapical myocardial infarction without significant peri-infarction ischemia. CCTA was then ordered.     CCTA in Aug 2023 showed Calcified plaque in the left main coronary artery causes moderate stenosis. Calcified plaque in the left anterior descending coronary artery causes moderate  to severe stenosis.  Calcified plaque in the left circumflex artery causes moderate stenosis.  Calcified plaque in the right coronary artery causes mild stenosis.  She was then set up for elective LHC with Dr. Kirk Ruths on 12/05/2021.  She previously reported Lipitor intolerance, but was willing to try Crestor.    Patient presented to [facility] on 11/27/2021 for evaluation of chest pain.  Dr. Guadlupe Spanish was covering in Dr. Theodora Blow absence.  Troponin was negative.  Patient underwent LHC by Dr. Guadlupe Spanish showing stenosis in small diagonal branch as seen on previous angiography. Mild nonobstructive coronary disease in major epicardial vessels.    She denies chest pain or dyspnea.  She reports "a little flutter" in her chest occurring once or twice daily and usually lasting 1 minute.    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    Essential hypertension 08/30/2021  Myasthenia gravis (CMS HCC) 08/30/2021       Allergies  Allergies   Allergen Reactions    Beta-Blockers (Beta-Adrenergic Blocking Agts)     Calcium Channel Blocking Agent Diltiazem Analogues     Codeine     Contrast Dye [Iv Contrast]     Erythromycin     Latex     Metformin     Methimazole     Penicillins        Medications    Current Outpatient Medications:     Amlodipine-Olmesartan 5-40 mg Oral Tablet, Take 1 Tablet by mouth Once a day, Disp: , Rfl:     aspirin (ECOTRIN) 81 mg Oral Tablet, Delayed Release (E.C.), Take 1 Tablet (81 mg total) by mouth Once a day, Disp: , Rfl:      clopidogreL (PLAVIX) 75 mg Oral Tablet, Take 1 Tablet (75 mg total) by mouth Once a day, Disp: , Rfl:     ergocalciferol, vitamin D2, (VITAMIN D) 1,250 mcg (50,000 unit) Oral Capsule, Take 1 Capsule (50,000 Units total) by mouth Every 7 days, Disp: , Rfl:     glucosamine/chondr su A sod (GLUCOSAMINE-CHONDROITIN) 167-133 mg Oral Capsule, Take 1 Capsule by mouth, Disp: , Rfl:     hydrOXYzine HCL (ATARAX) 25 mg Oral Tablet, Take 1 Tablet (25 mg total) by mouth Three times a day, Disp: , Rfl:     hyoscyamine sulfate (LEVSIN/SL) 0.125 mg Sublingual Tablet, Sublingual, Place under the tongue Every 4 hours as needed, Disp: , Rfl:     isosorbide mononitrate (IMDUR) 60 mg Oral Tablet Sustained Release 24 hr, Twice daily Take 1/2 tablet every evening., Disp: , Rfl:     levothyroxine (SYNTHROID) 100 mcg Oral Tablet, Take 1 Tablet (100 mcg total) by mouth Every morning, Disp: , Rfl:     metaxalone (SKELAXIN) 800 mg Oral Tablet, Take 1 Tablet (800 mg total) by mouth Four times a day, Disp: , Rfl:     nitroGLYCERIN (NITROSTAT) 0.4 mg Sublingual Tablet, Sublingual, Place 1 Tablet (0.4 mg total) under the tongue Every 5 minutes as needed for Chest pain for 3 doses over 15 minutes, Disp: , Rfl:     ondansetron (ZOFRAN) 4 mg Oral Tablet, Take 1 Tablet (4 mg total) by mouth Three times a day as needed, Disp: , Rfl:     pantoprazole (PROTONIX) 40 mg Oral Tablet, Delayed Release (E.C.), Take 1 Tablet (40 mg total) by mouth Once a day, Disp: , Rfl:     pregabalin (LYRICA) 50 mg Oral Capsule, Take 1 Capsule (50 mg total) by mouth Twice daily, Disp: , Rfl:     ranolazine (RANEXA) 500 mg Oral Tablet Sustained Release 12 hr, Take 1 Tablet (500 mg total) by mouth Twice daily, Disp: 60 Tablet, Rfl: 5    rosuvastatin (CRESTOR) 10 mg Oral Tablet, Take 1 Tablet (10 mg total) by mouth Every evening, Disp: 30 Tablet, Rfl: 5    sucralfate (CARAFATE) 1 gram Oral Tablet, Take 1 Tablet (1 g total) by mouth Four times a day - before meals and  bedtime, Disp: , Rfl:     traMADoL (ULTRAM) 50 mg Oral Tablet, Take by mouth Four times a day, Disp: , Rfl:     History  Past Medical History:   Diagnosis Date    Angina at rest (CMS [facility])     Carotid stenosis     Chronic nausea     Chronic pain     Chronic prescription opiate use  Combined form of age-related cataract, both eyes     COPD (chronic obstructive pulmonary disease) (CMS HCC)     Dry eye syndrome     Dyslipidemia     Gastroparesis     GERD (gastroesophageal reflux disease)     Graves disease     Graves' eye disease     History of stress test     Hypertension     MI (myocardial infarction) (CMS HCC)     times 3    Mood disorder (CMS HCC)     Myasthenia gravis (CMS HCC)     Osteoarthritis     Proximal limb weakness     Ramsay Hunt syndrome (geniculate herpes zoster)     Small vessel coronary artery disease     Thyroid eye disease          Past Surgical History:   Procedure Laterality Date    BREAST SURGERY      lump removed from left breast    CARDIAC CATHETERIZATION      ESOPHAGOGASTRODUODENOSCOPY      HX CHOLECYSTECTOMY      HX HYSTERECTOMY      HX LAP CHOLECYSTECTOMY      HX LAPAROTOMY      HX LYMPHADENECTOMY      HX VERTEBROPLASTY       Social History     Socioeconomic History    Marital status: Divorced   Tobacco Use    Smoking status: Former     Types: Cigarettes    Smokeless tobacco: Never   Vaping Use    Vaping status: Never Used   Substance and Sexual Activity    Alcohol use: Not Currently     Comment: occasional    Drug use: Never     Social Determinants of Health     Financial Resource Strain: Medium Risk (08/01/2020)    Received from [employer]     Financial Concerns: 2   Transportation Needs: Unknown (05/05/2019)    Received from [employer] [facility]eds     Lack of Transportation (Medical): 0   Social Connections: Unknown (05/05/2019)    Received from [employer]    Social Connections      Social Connection Calculated Score: 0    Received from [employer] [facility]    Housing Stability     Family Medical History:       Problem Relation (Age of Onset)    Elevated Lipids Mother, Father    Heart Disease Mother    Hypertension (High Blood Pressure) Father    Kidney Disease Mother              Review of Systems:  General: Denies fever or chills. No fatigue.   CNS: Denies syncope or dizziness. No headache.  Eyes: Denies recent visual changes. No pain.  Ears, Nose, and Throat: Denies ear pain, nasal congestion, or sore throat.  Endocrine: Denies excessive hunger or thirst. No heat/cold intolerance.  Respiratory: Denies shortness of breath. No cough.  Cardiovascular: Per HPI Denies claudication or varicose veins.  GI: Denies nausea/vomiting. No black/bloody bowel movements.  GU: Denies hematuria or dysuria. No incontinence.  Musculoskeletal: Denies joint swelling or decreased ROM.  Skin: No rashes, hives, or eczema.  Psychiatric: Denies anxiety, depression, or insomnia.  Hematologic: No bleedings disorders or swollen nodes.    All other systems reviewed and either negative or not pertinent.     Physical  Examination:  BP (!) 102/58 (Site: Left, Patient Position: Sitting, Cuff Size: Adult)   Pulse 72   Temp (!) 35.8 C (96.4 F) (Temporal)   Resp 16   Ht 1.499 m (4\' 11" )   Wt 53.7 kg (118 lb 4.4 oz)   SpO2 94%   BMI 23.89 kg/m       General: Awake. Well nourished. No acute distress. A&O x 3.  HEENT: Normocephalic, atraumatic. PERRL. Normal hearing.  Neck: No JVD or bruit. Trachea midline.  Lungs: Clear to auscultation. No rhonchi or wheezing noted. Unlabored.  Heart: Regular rate and rhythm. No murmur, rub, or gallop.  Abdomen: BS normal x 4 quadrants. Soft, non-tender, non-distended.  Extremities: No clubbing or cyanosis. No edema.  Psychiatric: Cooperative, appropriate mood and affect.  Musculoskeletal: Strength grips are equal. No red, swollen joints. MAE.  Skin: Warm, dry, and intact. No rashes or  hives are noted.       Diagnostics  Echo:  March 2023  Summary:   1. Left ventricle: The cavity size is normal. Wall thickness is mildly increased. Systolic function is normal. The estimated ejection fraction is 5055% Regional wall motion abnormalities cannot be excluded.   2. Ventricular septum: Thickness is mildly increased.   3. Mitral valve: There is moderate regurgitation.   4. Tricuspid valve: There is mild regurgitation.   5. A trivial pericardial effusion cannot be excluded.  Prepared and electronically signed by   Casimer Bilis, MD      Coronary CTA:  Aug 2023  FINDINGS:  RCA: Calcified plaques causing mild stenoses.  Right coronary artery is dominant.     LEFT MAIN CORONARY ARTERY: Calcified plaque along the distal aspect causes moderate stenosis.     LAD: Calcified plaques along the proximal and mid portions caused moderate to severe stenoses.     LCx: Calcified plaques cause up to moderate stenoses.     NONCORONARY CARDIAC FINDINGS:   The heart is normal in size. There are no findings of intracardiac thrombus.     NONCARDIAC FINDINGS:   Foci of scarring and/or atelectasis are noted in the lungs.  Degenerative osseous changes are present.  ___________________________________  IMPRESSION:  Calcified plaque in the left main coronary artery causes moderate stenosis. Calcified plaque in the left anterior descending coronary artery causes moderate to severe stenosis.  Calcified plaque in the left circumflex artery causes moderate stenosis.  Calcified plaque in the right coronary artery causes mild stenosis.      Catheterization:  LHC by Dr Guadlupe Spanish in Aug 2023  Findings:   Hemodynamics:   Left ventricular systolic pressure was 173 with an end-diastolic pressure of 16.  Aortic pressure was 171/75 with a mean of 112 mmHg.     Left main coronary artery:   Left main coronary artery had less than 10% stenosis.     Left anterior descending artery:   Left anterior descending artery had mid 20% stenosis.  First  diagonal branch was a small caliber vessel with mid 70-80% stenosis, the size of the vessel is less than 2 mm in size.     Left circumflex coronary artery:   Left circumflex coronary had mid 20% stenosis.  The first and second obtuse marginal branches were small caliber vessels with 10% stenosis.     Right coronary artery:   Right coronary artery was dominant vessel with proximal 20% stenosis.  Posterior descending artery and posterior lateral branch had less than 10% stenosis.     Left ventricular function study:  Left ventricular systolic function was in low normal range with estimated ejection fraction 50-55%.     Impression:   1.  Stenosis in small diagonal branch as seen on previous angiography.   2.  Mild nonobstructive coronary disease in major epicardial vessels.   3.  Low normal left ventricular systolic function.     Recommendations:   1.  Patient will receive standard post procedural care   2.  Patient to continue medical therapy for small vessel coronary artery disease.   3.  Patient can follow-up with Dr. Kirk Ruths in the office.        Orders Placed This Encounter    TRANSTHORACIC ECHOCARDIOGRAM - ADULT       Medications Discontinued During This Encounter   Medication Reason    hydroCHLOROthiazide (HYDRODIURIL) 25 mg Oral Tablet Patient states no longer taking    metoprolol tartrate (LOPRESSOR) 25 mg Oral Tablet Patient states no longer taking       Assessment and Plan:  Assessment/Plan   1. Essential hypertension    2. Moderate mitral regurgitation    3. CAD in native artery    4. Palpitations        PLAN:  Patient tells me she is the primary caregiver for her husband she was recently diagnosed with pancreatic cancer.  She denies chest pain or dyspnea, but does note 1-2 episodes of palpitations throughout the day that usually last 1 minute.  She declines monitor at this time.  Moderate mitral regurg noted on prior echo.  Repeat echo to assess EF/valvular disease.  Nephrology is managing her blood  pressure.  PCP follows lab work, including lipid panel. Follow-up in 1 year sooner if needed.      Follow up:  Return in about 1 year (around 09/24/2023).      The patient was given the opportunity to ask questions and those questions were answered to the patient's satisfaction. The patient was encouraged to call with any additional questions or concerns. Discussed with the effects and side effects of medications. Medication safety was discussed. The patient was informed to contact the office within 7 business days if a message/lab results/referral/imaging results have not been conveyed to the patient.       Jori Moll, APRN,NP-C  [facility]  Cardiology  [facility]    A portion of this documentation may have been generated using [facility] voice recognition software and may contain syntax/voice recognition errors.

## 2022-10-14 ENCOUNTER — Ambulatory Visit (HOSPITAL_COMMUNITY): Payer: Self-pay

## 2022-10-30 ENCOUNTER — Ambulatory Visit (HOSPITAL_COMMUNITY): Payer: Self-pay

## 2022-12-22 ENCOUNTER — Ambulatory Visit (HOSPITAL_BASED_OUTPATIENT_CLINIC_OR_DEPARTMENT_OTHER): Payer: Self-pay | Admitting: PAIN MANAGEMENT

## 2023-01-12 ENCOUNTER — Encounter (HOSPITAL_BASED_OUTPATIENT_CLINIC_OR_DEPARTMENT_OTHER): Payer: Self-pay

## 2023-01-12 ENCOUNTER — Ambulatory Visit (HOSPITAL_BASED_OUTPATIENT_CLINIC_OR_DEPARTMENT_OTHER): Payer: Self-pay | Admitting: PAIN MANAGEMENT

## 2023-04-30 ENCOUNTER — Telehealth (HOSPITAL_BASED_OUTPATIENT_CLINIC_OR_DEPARTMENT_OTHER): Payer: Self-pay | Admitting: VASCULAR SURGERY

## 2023-04-30 NOTE — Telephone Encounter (Signed)
Known Dr. Orvan Falconer pt. Called to report an ER visit at [facility].    04/26/23, pt was transported via EMS to [facility] for eval of hypotension, gen weakness, syncopal episodes, and "full body muscle cramps". Pt stated she started vomiting 3 days prior, and had a syncopal episode where she struck her head when falling. Full body cramps started on the 26th, and worsening gen weakness, BP was also low.    Pt was treated, and recommended to follow up w. Dr. Dalbert Garnet w/ u/s and appt.     Pt called, does not want either appt at [facility]. She says she is calling to cancel both, and will keep her current appts and u/s w/ our office.       Mauri Brooklyn, [place]   04/30/23 1529

## 2023-07-29 ENCOUNTER — Ambulatory Visit (HOSPITAL_COMMUNITY): Payer: Self-pay

## 2023-08-05 ENCOUNTER — Ambulatory Visit (HOSPITAL_BASED_OUTPATIENT_CLINIC_OR_DEPARTMENT_OTHER): Payer: Self-pay | Admitting: VASCULAR SURGERY

## 2023-09-24 ENCOUNTER — Ambulatory Visit (INDEPENDENT_AMBULATORY_CARE_PROVIDER_SITE_OTHER): Payer: Self-pay | Admitting: Family

## 2024-03-31 ENCOUNTER — Encounter (HOSPITAL_COMMUNITY): Payer: Self-pay

## 2024-03-31 ENCOUNTER — Emergency Department
Admission: EM | Admit: 2024-03-31 | Discharge: 2024-04-01 | Disposition: A | Discharge status: Home / Self Care | Attending: Emergency Medicine | Admitting: Emergency Medicine

## 2024-03-31 ENCOUNTER — Other Ambulatory Visit: Payer: Self-pay

## 2024-03-31 ENCOUNTER — Emergency Department (HOSPITAL_COMMUNITY)

## 2024-03-31 DIAGNOSIS — H532 Diplopia: Secondary | ICD-10-CM

## 2024-03-31 DIAGNOSIS — E0500 Thyrotoxicosis with diffuse goiter without thyrotoxic crisis or storm: Secondary | ICD-10-CM | POA: Insufficient documentation

## 2024-03-31 DIAGNOSIS — E069 Thyroiditis, unspecified: Secondary | ICD-10-CM

## 2024-03-31 DIAGNOSIS — R42 Dizziness and giddiness: Secondary | ICD-10-CM | POA: Insufficient documentation

## 2024-03-31 DIAGNOSIS — R519 Headache, unspecified: Secondary | ICD-10-CM | POA: Insufficient documentation

## 2024-03-31 DIAGNOSIS — H04123 Dry eye syndrome of bilateral lacrimal glands: Secondary | ICD-10-CM

## 2024-03-31 DIAGNOSIS — H04129 Dry eye syndrome of unspecified lacrimal gland: Secondary | ICD-10-CM | POA: Insufficient documentation

## 2024-03-31 DIAGNOSIS — N179 Acute kidney failure, unspecified: Secondary | ICD-10-CM | POA: Insufficient documentation

## 2024-03-31 DIAGNOSIS — R9431 Abnormal electrocardiogram [ECG] [EKG]: Secondary | ICD-10-CM | POA: Insufficient documentation

## 2024-03-31 DIAGNOSIS — E079 Disorder of thyroid, unspecified: Secondary | ICD-10-CM

## 2024-03-31 DIAGNOSIS — E039 Hypothyroidism, unspecified: Secondary | ICD-10-CM | POA: Insufficient documentation

## 2024-03-31 DIAGNOSIS — G7000 Myasthenia gravis without (acute) exacerbation: Secondary | ICD-10-CM | POA: Insufficient documentation

## 2024-03-31 DIAGNOSIS — H02209 Unspecified lagophthalmos unspecified eye, unspecified eyelid: Secondary | ICD-10-CM | POA: Insufficient documentation

## 2024-03-31 LAB — BASIC METABOLIC PANEL
ANION GAP: 11 mmol/L (ref 4–13)
ANION GAP: 7 mmol/L (ref 4–13)
BUN/CREA RATIO: 14 (ref 6–22)
BUN/CREA RATIO: 15 (ref 6–22)
BUN: 17 mg/dL (ref 8–25)
BUN: 18 mg/dL (ref 8–25)
CALCIUM: 9.3 mg/dL (ref 8.6–10.3)
CALCIUM: 9.1 mg/dL (ref 8.6–10.3)
CHLORIDE: 106 mmol/L (ref 96–111)
CHLORIDE: 104 mmol/L (ref 96–111)
CO2 TOTAL: 18 mmol/L — ABNORMAL LOW (ref 23–31)
CO2 TOTAL: 25 mmol/L (ref 23–31)
CREATININE: 1.24 mg/dL — ABNORMAL HIGH (ref 0.60–1.05)
CREATININE: 1.23 mg/dL — ABNORMAL HIGH (ref 0.60–1.05)
GLUCOSE: 122 mg/dL (ref 65–125)
GLUCOSE: 108 mg/dL (ref 65–125)
POTASSIUM: 5 mmol/L (ref 3.5–5.1)
POTASSIUM: 4 mmol/L (ref 3.5–5.1)
SODIUM: 135 mmol/L — ABNORMAL LOW (ref 136–145)
SODIUM: 136 mmol/L (ref 136–145)
eGFRcr - FEMALE: 49 mL/min/1.73mˆ2 — ABNORMAL LOW (ref >=60)
eGFRcr - FEMALE: 49 mL/min/1.73mˆ2 — ABNORMAL LOW (ref >=60)

## 2024-03-31 LAB — CBC WITH DIFF
BASOPHIL #: <0.1 x10ˆ3/uL (ref <=0.20)
BASOPHIL %: 0.2 %
EOSINOPHIL #: <0.1 x10ˆ3/uL (ref <=0.50)
EOSINOPHIL %: 0 %
HCT: 35.2 % (ref 34.8–46.0)
HGB: 11.9 g/dL (ref 11.5–16.0)
IMMATURE GRANULOCYTE #: <0.1 x10ˆ3/uL (ref <0.10)
IMMATURE GRANULOCYTE %: 0.4 % (ref 0.0–1.0)
LYMPHOCYTE #: 1.05 x10ˆ3/uL (ref 1.00–4.80)
LYMPHOCYTE %: 21.3 %
MCH: 29 pg (ref 26.0–32.0)
MCHC: 33.8 g/dL (ref 31.0–35.5)
MCV: 85.6 fL (ref 78.0–100.0)
MONOCYTE #: <0.1 x10ˆ3/uL — ABNORMAL LOW (ref 0.20–1.10)
MONOCYTE %: 1.8 %
MPV: 9.1 fL (ref 8.7–12.5)
NEUTROPHIL #: 3.77 x10ˆ3/uL (ref 1.50–7.70)
NEUTROPHIL %: 76.3 %
PLATELETS: 289 x10ˆ3/uL (ref 150–400)
RBC: 4.11 x10ˆ6/uL (ref 3.85–5.22)
RDW-CV: 13.6 % (ref 11.5–15.5)
WBC: 4.9 x10ˆ3/uL (ref 3.7–11.0)

## 2024-03-31 LAB — ECG 12-LEAD
Atrial Rate: 83 {beats}/min
Calculated P Axis: 74 degrees
Calculated R Axis: 62 degrees
Calculated T Axis: 45 degrees
PR Interval: 138 ms
QRS Duration: 90 ms
QT Interval: 374 ms
QTC Calculation: 439 ms
Ventricular rate: 83 {beats}/min

## 2024-03-31 LAB — C-REACTIVE PROTEIN(CRP),INFLAMMATION: CRP INFLAMMATION: 2.4 mg/L (ref <8.0)

## 2024-03-31 LAB — LACTIC ACID LEVEL W/ REFLEX FOR LEVEL >2.0: LACTIC ACID: 1 mmol/L (ref 0.5–2.2)

## 2024-03-31 LAB — THYROID STIMULATING HORMONE WITH FREE T4 REFLEX: TSH: 4.092 u[IU]/mL (ref 0.350–4.940)

## 2024-03-31 LAB — TROPONIN-I (FOR ED ONLY): TROPONIN-I HS: <2.7 ng/L (ref <=14.0)

## 2024-03-31 LAB — MAGNESIUM: MAGNESIUM: 2 mg/dL (ref 1.8–2.6)

## 2024-03-31 LAB — SEDIMENTATION RATE: ERYTHROCYTE SEDIMENTATION RATE (ESR): 61 mm/h — ABNORMAL HIGH (ref 0–30)

## 2024-03-31 MED ORDER — IOPAMIDOL 370 MG IODINE/ML (76 %) INTRAVENOUS SOLUTION
50.0000 mL | INTRAVENOUS | Status: AC
  Administered 2024-03-31: 50 mL via INTRAVENOUS

## 2024-03-31 MED ORDER — MORPHINE 4 MG/ML INTRAVENOUS SOLUTION
4.0000 mg | INTRAVENOUS | Status: AC
  Administered 2024-03-31: 4 mg via INTRAVENOUS
  Filled 2024-03-31: qty 1

## 2024-03-31 MED ORDER — SODIUM CHLORIDE 0.9 % IV BOLUS
1000.0000 mL | INJECTION | Status: AC
  Administered 2024-03-31: 1000 mL via INTRAVENOUS
  Administered 2024-03-31: 0 mL via INTRAVENOUS

## 2024-03-31 MED ORDER — ARTIFICIAL TEARS WITH LANOLIN EYE OINTMENT
TOPICAL_OINTMENT | Freq: Four times a day (QID) | OPHTHALMIC | 1 refills | Status: AC
  Filled 2024-03-31: qty 3.5, fill #0

## 2024-03-31 MED ORDER — ARTIFICIAL TEARS (WHITE PETROLATUM-MINERAL OIL) OPHTH OINTMENT WRAPPER
TOPICAL_OINTMENT | OPHTHALMIC | Status: DC
  Administered 2024-04-01 (×2): 0 mL via OPHTHALMIC
  Filled 2024-03-31 (×2): qty 3.5

## 2024-03-31 MED ORDER — MECLIZINE 12.5 MG TABLET
12.5000 mg | ORAL_TABLET | ORAL | Status: AC
  Administered 2024-03-31: 12.5 mg via ORAL
  Filled 2024-03-31: qty 1

## 2024-03-31 MED ORDER — CARBOXYMETHYLCELLULOSE SODIUM 0.5 % EYE DROPS
1.0000 [drp] | Freq: Four times a day (QID) | OPHTHALMIC | Status: DC
  Administered 2024-03-31 (×2): 1 [drp] via OPHTHALMIC
  Administered 2024-04-01: 0 [drp] via OPHTHALMIC
  Filled 2024-03-31 (×2): qty 15

## 2024-03-31 MED ORDER — DIPHENHYDRAMINE 50 MG/ML INJECTION SOLUTION
25.0000 mg | INTRAMUSCULAR | Status: AC
  Administered 2024-03-31: 25 mg via INTRAVENOUS
  Filled 2024-03-31: qty 1

## 2024-03-31 MED ORDER — ARTIFICIAL TEARS (HYPROMELLOSE) 0.5 % EYE DROPS
1.0000 [drp] | OPHTHALMIC | 1 refills | Status: AC
  Filled 2024-03-31: qty 15, 15d supply, fill #0

## 2024-03-31 MED ORDER — PROCHLORPERAZINE EDISYLATE 10 MG/2 ML (5 MG/ML) INJECTION SOLUTION
10.0000 mg | INTRAMUSCULAR | Status: AC
  Administered 2024-03-31: 10 mg via INTRAVENOUS
  Filled 2024-03-31: qty 2

## 2024-03-31 NOTE — Consults (Signed)
 [facility]   Ophthalmology Initial Consult Note      Kathleen Rios, [place], 64 y.o. female  [id number]  Date of Service:  03/31/2024  Requesting physician: Davis Caprice Standing, MD   Information Obtained from: Patient and Healthcare Provider  Chief Complaint:  monocular diplopia / blurry vision      HPI:  Kathleen Rios is a 64 y.o. female with a PMH COPD, HTN, HLD who presents as transfer from [facility] for CTA of brain.     Symptoms started couple days ago with headache. Pt checked blood pressure at home was high at 160/100, pt woke up with stabbing headache and had large white flash of light in both eyes, pt fell back asleep, called PCP next morning and was directed to come to the hospital. Pt went to [facility], got scans.     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 Vision is 20/70 at near.     Pt reports double vision for years. She describes her double vision as a shadow and notes that it is present when either eye is open and when both eyes are open. Pt states vision is like looking thru frosted glass for past few days. Notes eye pain, which is improving. Headache is on her scalp and posteriorly. Denies jaw pain.      Pt had CT noncon at outside facility that showed no acute abnormalities.     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 Has Grave's disease s/p RAI, now on synthroid , recently increased dose 2 days ago.       HISTORY:  Past Ocular Hx: glaucoma (s/p laser), ocular myasthenia, graves disease diagnosed in 2014 (TED),. History of radioactive iodine  4-5x rounds, most recently in 2018; cataracts OU      Ocular meds: Systane QID, Gen teal ointment at night time, moisture chamber at night time  Local eye doctor: Dr. Tedd, [facility]     Prior to Admission Meds:   Medications Prior to Admission       Prescriptions    Amlodipine-Olmesartan 5-40 mg Oral Tablet    Take 1 Tablet by mouth Once a day    aspirin (ECOTRIN) 81 mg Oral Tablet, Delayed  Release (E.C.)    Take 1 Tablet (81 mg total) by mouth Once a day    clopidogreL (PLAVIX) 75 mg Oral Tablet    Take 1 Tablet (75 mg total) by mouth Once a day    ergocalciferol, vitamin D2, (VITAMIN D) 1,250 mcg (50,000 unit) Oral Capsule    Take 1 Capsule (50,000 Units total) by mouth Every 7 days    glucosamine/chondr su A sod (GLUCOSAMINE-CHONDROITIN) 167-133 mg Oral Capsule    Take 1 Capsule by mouth    hydrOXYzine  HCL (ATARAX ) 25 mg Oral Tablet    Take 1 Tablet (25 mg total) by mouth Three times a day    hyoscyamine sulfate (LEVSIN/SL) 0.125 mg Sublingual Tablet, Sublingual    Place under the tongue Every 4 hours as needed    isosorbide mononitrate (IMDUR) 60 mg Oral Tablet Sustained Release 24 hr    Twice daily Take 1/2 tablet every evening.    levothyroxine  (SYNTHROID ) 100 mcg Oral Tablet    Take 1 Tablet (100 mcg total) by mouth Every morning    metaxalone (SKELAXIN) 800 mg Oral Tablet    Take 1 Tablet (800 mg total) by mouth Four times a day    nitroGLYCERIN (NITROSTAT) 0.4 mg Sublingual Tablet,  Sublingual    Place 1 Tablet (0.4 mg total) under the tongue Every 5 minutes as needed for Chest pain for 3 doses over 15 minutes    ondansetron (ZOFRAN) 4 mg Oral Tablet    Take 1 Tablet (4 mg total) by mouth Three times a day as needed    pantoprazole (PROTONIX) 40 mg Oral Tablet, Delayed Release (E.C.)    Take 1 Tablet (40 mg total) by mouth Once a day    pregabalin  (LYRICA ) 50 mg Oral Capsule    Take 1 Capsule (50 mg total) by mouth Twice daily    ranolazine  (RANEXA ) 500 mg Oral Tablet Sustained Release 12 hr    Take 1 Tablet (500 mg total) by mouth Twice daily    rosuvastatin  (CRESTOR ) 10 mg Oral Tablet    Take 1 Tablet (10 mg total) by mouth Every evening    sucralfate  (CARAFATE ) 1 gram Oral Tablet    Take 1 Tablet (1 g total) by mouth Four times a day - before meals and bedtime    traMADoL  (ULTRAM ) 50 mg Oral Tablet    Take by mouth Four times a day            Allergies[1]  Social History     Tobacco Use     Smoking status: Former     Types: Cigarettes    Smokeless tobacco: Never   Substance Use Topics    Alcohol use: Not Currently     Comment: occasional     Family Medical History:       Problem Relation (Age of Onset)    Elevated Lipids Mother, Father    Heart Disease Mother    Hypertension (High Blood Pressure) Father    Kidney Disease Mother              ROS:   Other than ROS in the HPI, all other systems were negative.    Exam:  Temperature: 36.4 C (97.6 F)  Heart Rate: 90  BP (Non-Invasive): 112/75  Respiratory Rate: 16    Visual Acuity:   Near with +3.00 trial lenses  ph  Color plates:    OD 79/69  NI  14/14    OS 20/70  NI  14/14        OD OS   Confr Vis Fields WNL WNL   EOM (Primary) WNL; no fatigability on upgaze WNL; no fatigability on upgaze   Pupils WNL WNL   Reaction, Direct WNL WNL                  Consensual WNL WNL                  RAPD No No        Adnexa            Proptosis; superior scleral show, lagophthalmos 2 mm;  Proptosis; superior scleral show, lagophthalmos 1 mm   Conjunctiva (palpebral) WNL WNL   Conjunctiva (bulbar) WNL WNL   Cornea 4+ PEE 4+ PEE   Anterior chamber WNL WNL   Iris WNL WNL   Lens:  2+ NSC 2+ NSC   IOP by tonopen 11 11        Fundus - Dilated? Yes    Optic Disc - C:D Ratio 0.3 0.3                      Appearance  WNL WNL   Post Seg:  Retina  Vitreous WNL WNL                   Vessels  WNL WNL                   Macula WNL WNL                   Periphery WNL WNL     photograph of patient (in media tab)    Neuro:  Oriented to person, place, and time:  Yes  Psychiatric:  Mood and Affect Appropriate:  Yes    Labs/imaging:    No results found in last 30 days.     No orders to display         A/P:   Kathleen Rios is a 64 y.o. female who presented to the [facility] ED with as transfer from [facility] for CTA intra/extracranial. Pt has had headache for past 2 days. Eye exam largely unremarkable apart from significant dry eye. 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  Decreased visual acuity / monocular double vision likely secondary to surface disease and cataract.     Dry eye syndrome, both eyes  Lagophthalmos   - advise smoking cessation (marijuana)  - start preservative free artificial tears q2hr OU  - start Lacrilube ointment QID OU  - start warm compresses BID OU   - remainder of headache workup per primary team    - pt would like to follow up with home ophthalmologist (Dr. Tedd) at [facility] for surface check     - Please place formal EPIC consultation order if not already done.     Garnette Door, MD, 03/31/2024, 17:19  PGY-2, Ophthalmology  [facility]       I did not see or examine the patient. I reviewed the resident's note.  I agree with the findings and plan of care as documented in the resident's note.  Any exceptions/additions are edited/noted.    Viola Sheen, MD 04/05/2024, 23:11         [1]   Allergies  Allergen Reactions    Beta-Blockers (Beta-Adrenergic Blocking Agts)     Calcium Channel Blocking Agent Diltiazem Analogues     Codeine     Contrast Dye [Iv Contrast]  Other Adverse Reaction (Add comment)     Pt states syncopal episode without pretreatment     Erythromycin     Latex     Metformin      Methimazole     Penicillins

## 2024-03-31 NOTE — Incoming ED Transfer Note (Signed)
 43F  Full code  Transfer from [facility]    20G RAC  NPO  UA= +THC    Arrived to [facility] with HA, HTN, blurred vision, Left shoulder pressure for three days  Right eye blurry since last night and left eye legally blind  Double vision and pain on movement on eyes peripherally    Pt took Nitroglycerin at home with temporary relief  CT head WO- no abnormalities  Chest XR and EKG good    Sending to [facility] ED for Ophthalmology and possible surgery to shave bone  Concerned for uveitis    Hx glaucoma, Ocular Myasthenia Gravis and Graves     A and O  Pain meds  Stand with assist  191/80 = given metoprolol  98% RA  90 HR  36.2 c

## 2024-03-31 NOTE — ED Attending Note (Signed)
 I was physically present and directly supervised this patients care. Patient seen and examined with the resident, Dr. Blinda, and history and exam reviewed. Key elements in addition to and/or correction of that documentation are as follows:    Patient is a 64 y.o. female  presenting to the ED with chief complaint of vision changes. Patient is a transfer from [facility] for vision changes. Patient with hx of thyroid  eye disease. Patient with IV Dye allergy. Patient pre treated for IV dye and scheduled scan at [facility] per documentation from [facility]. Patient with pain with extraocular movements and double vision. 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 .     ROS: Otherwise negative, if not commented on in the HPI.            PMH, PSH, medications, allergies, SH, and FH per resident note. Important aspects of these fields pertaining to today's visit taken into consideration during history/physical and MDM.    Filed Vitals:    03/31/24 1512 03/31/24 1513   BP: 112/75    Pulse:  90   Resp:  16   Temp:  36.4 C (97.6 F)        Physical Exam: PER RESIDENT   Vitals reviewed.    Workup:   Orders Placed This Encounter    CT ANGIO INTRACRANIAL W/WO IV CONTRAST    CT ANGIO CAROTID-EXTRACRANIAL (NECK) W IV CONTRAST    BASIC METABOLIC PANEL    CBC/DIFF    THYROID  STIMULATING HORMONE W/ FREE T4 REFLEX    TROPONIN-I (FOR ED ONLY)    CBC WITH DIFF    MAGNESIUM    CANCELED: SEDIMENTATION RATE    C-REACTIVE PROTEIN(CRP),INFLAMMATION    LACTIC ACID LEVEL W/ REFLEX FOR LEVEL >2.0    EXTRA TUBES    BLUE TOP TUBE    GOLD TOP TUBE    SEDIMENTATION RATE    BASIC METABOLIC PANEL    ECG 12-LEAD    prochlorperazine  (COMPAZINE ) 5 mg/mL injection    diphenhydrAMINE  (BENADRYL ) 50 mg/mL injection    morphine  4 mg/mL injection    NS bolus infusion 1,000 mL    iopamidol  (ISOVUE -370) 76% solution    iopamidol  (ISOVUE -370) 76% solution    meclizine  (ANTIVERT ) tablet    artificial tears with lanolin (LACRILUBE) Ophthalmic Ointment    artificial tear, hypromellose,  (ISOPTO) 0.5 % Ophthalmic Drops    traMADol  (ULTRAM ) tablet    sucralfate  (CARAFATE ) tablet    hydrOXYzine  (ATARAX ) 10mg  per 5mL oral liquid    levothyroxine  (SYNTHROID ) tablet       Course: Patient evaluated for vision changes.    EKG: interpreted see muse  Imaging: reviewed    Labs: reviewed    MDM:   During the patient's stay in the emergency department, the above listed imaging and/or labs were performed to assist with medical decision making and were reviewed by myself when available for review. Upon review of the ancillary tests listed above, in conjunction with the clinical history and physical exam, it seems that the patient vision changes. Patient with Ophthalmology eval in the ED and diagnosed with dry eyes. Patient with CTA performed and negative for acute findings. Patient cleared for outpatient follow up with eye drops. Patient discharged.     Impression: Vision changes    Chart completed after conclusion of patient care due to time constraints of direct patient care during shift.

## 2024-03-31 NOTE — Discharge Instructions (Signed)
 We would like for you to follow up with Opthalmology at [facility] in the next week.  Per our ophthalmology recommendations here, please apply warm compresses twice a day to both eyes, start Lacri-Lube ointment 4 times a day to both eyes, and preservative-free artificial tears every 2 hours to both eyes.    Return to the Emergency Department with any new, concerning, or worsening symptoms. These are symptoms including, but not limited to, persistent or increasing fever/inability to tolerate liquids/blood in your urine or stool/chest pain/shortness of breath/worsening pain. Please read all attached instructions at the end of your discharge papers for further information and warning signs.    For all medications prescribed, please follow the instructions as labeled and talk to your pharmacist if you have any questions/concerns.

## 2024-03-31 NOTE — ED Provider Notes (Signed)
 [facility] - Emergency Department  ED Primary Note  History of Present Illness     Kathleen Rios is a 64 y.o. female with a PMH pertinent for COPD, MI, HTN, HLD, thyroid  eye disease, ocular myasthenia gravis, and glaucoma who presents to the ED via EMS from [facility] with a chief complaint of Vision Change.    Pt presented to [facility] for bilateral eye pain and blurred vision in the right eye. 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 Pt underwent CT head without contrast with no acute abnormalities. She was referred to [facility] for contrasted CT and optho consult with c/f uveitis. Pt underwent 13 hour pretreatment protocol at outside facility for CT with contrast to be done at [facility]. She currently complains of pain and nausea with EOM. Pt reports her Synthroid  dose was increased 2 weeks ago.     See pt chart for a complete list of allergies which includes contrast dye, codeine, penicillins and pertinent medications including amlodipine, Lyrica , hydroxyzine , 81 mg ASA, Plavix, Synthroid .    Patient denies fevers, chest pain, shortness of breath.    Physical Exam   ED Triage Vitals   BP (Non-Invasive) 03/31/24 1512 112/75   Heart Rate 03/31/24 1513 90   Respiratory Rate 03/31/24 1513 16   Temperature 03/31/24 1513 36.4 C (97.6 F)   SpO2 03/31/24 1845 93 %   Weight 03/31/24 1513 49.4 kg (108 lb 14.5 oz)   Height 03/31/24 1513 1.499 m (4' 11)     Physical Exam      Please see ED course for physical exam      Patient Data   Labs Ordered/Reviewed   BASIC METABOLIC PANEL - Abnormal; Notable for the following components:       Result Value    SODIUM 135 (*)     CO2 TOTAL 18 (*)     CREATININE 1.24 (*)     eGFRcr - FEMALE 49 (*)     All other components within normal limits   CBC WITH DIFF - Abnormal; Notable for the following components:    MONOCYTE # <0.10 (*)     All other components within normal limits   SEDIMENTATION RATE - Abnormal; Notable for the following components:    ERYTHROCYTE SEDIMENTATION RATE (ESR)  61 (*)     All other components within normal limits   BASIC METABOLIC PANEL - Abnormal; Notable for the following components:    CREATININE 1.23 (*)     eGFRcr - FEMALE 49 (*)     All other components within normal limits   THYROID  STIMULATING HORMONE WITH FREE T4 REFLEX - Normal   TROPONIN-I (FOR ED ONLY) - Normal   MAGNESIUM - Normal   C-REACTIVE PROTEIN(CRP),INFLAMMATION - Normal   LACTIC ACID LEVEL W/ REFLEX FOR LEVEL >2.0 - Normal   CBC/DIFF    Narrative:     The following orders were created for panel order CBC/DIFF.  Procedure                               Abnormality         Status                     ---------                               -----------         ------  CBC WITH IPQQ[[id number]]                Abnormal            Final result                 Please view results for these tests on the individual orders.   EXTRA TUBES    Narrative:     The following orders were created for panel order EXTRA TUBES.  Procedure                               Abnormality         Status                     ---------                               -----------         ------                     BLUE TOP ULAZ[[id number]]                                    Final result               GOLD TOP ULAZ[[id number]]                                    Final result                 Please view results for these tests on the individual orders.   BLUE TOP TUBE   GOLD TOP TUBE     CT ANGIO CAROTID-EXTRACRANIAL (NECK) W IV CONTRAST   Final Result by Edi, Radresults In (01/01 1854)   Patent major arterial vasculature of the neck without high-grade stenosis/occlusion.            CT ANGIO INTRACRANIAL W/WO IV CONTRAST   Final Result by Edi, Radresults In (01/01 1833)   1. No acute intracranial process.   2. No large vessel occlusion or high-grade stenosis.        Medical Decision Making        Medical Decision Making  Amount and/or Complexity of Data Reviewed  Labs: ordered.  Radiology: ordered. Decision-making details  documented in ED Course.  ECG/medicine tests: ordered and independent interpretation performed.    Risk  OTC drugs.  Prescription drug management.  Parenteral controlled substances.      ED Course as of 04/02/24 0038   Thu Mar 31, 2024   1506 Patient being transferred from [facility] for vision changes in the setting of thyroid  disease however has an IV contrast dye allergy.  They have completed a 13 hour pretreatment protocol with methylprednisolone at 40 mg given at midnight, 6:13 a.m. and 8:52 a.m. as well as Benadryl  at midnight.  She is scheduled for a CTA intra and extracranial at 6:00 p.m. today on 03/31/2024.     1509 Per triage note, transfer from [facility] after arrival with a headache, hypotension, blurry vision and left shoulder pressure for three days.  She has had a right eye blurry vision since last night and baseline  left eye blindness.  Also having double vision and pain on movement of the eyes.  Concerns for uveitis.  History of glaucoma, ocular myasthenia gravis, Graves.  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  Blurry vision associated with a headache, chest pressure and left shoulder pain for three days with the bilateral lower extremity pain.  Nitroglycerin did help chest pain.  Patient had a CT head without contrast done showing no abnormalities.  Patient was prescribed Plavix, levothyroxine  112 mcg.  She is allergic to beta blockers, calcium channel blockers which causes hypertension.  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  Ocular pressure testing done in ED with 17 and right eye, 16 in the left eye and 20/200 vision bilaterally.  ESR was normal and CRP was mildly high.  Patient endorses she is not currently taking levothyroxine .  Patient currently not taking prednisone, myasthenia gravis of seronegative    1601 Optho aware, needs CTA   1607 CREATININE(!): 1.24  Giving 1 L over 60 minutes   1626 SEDIMENTATION RATE(!): 61   1720 TSH: 4.092   1720 Patient alert oriented x4, mild acute distress  Regular rate and rhythm, clear breath sounds bilaterally  No conjunctivitis, pupils are equal and reactive bilaterally, no evidence of periorbital or orbital cellulitis  Mild proptosis appreciated bilaterally without conjunctivitis or discharge  No limitation in extraocular movements but does have pain in all directions   1740 CTAs for dispo, already premedicated per medical records   1901 CT ANGIO CAROTID-EXTRACRANIAL (NECK) W IV CONTRAST  Patent major arterial vasculature of the neck without high-grade stenosis/occlusion.     1901 CT ANGIO INTRACRANIAL W/WO IV CONTRAST  1. No acute intracranial process.  2. No large vessel occlusion or high-grade stenosis.     2027 CREATININE(!): 1.23  Small downtrend s/p fluids   2222 Ambulatory and p.o. challenge passed per nursing        - Patient medicated with Compazine  and Benadryl  given subjective nausea.  Patient with no leukocytosis or fever, low concern for infectious process.  Patient's creatinine remained stable at roughly 1.2, believe that this is patients rough baseline.  ESR is elevated but CRP is not, low concern for temporal arteritis given no ophthalmology concern or tenderness to palpation.  TSH is within normal limits at this time.  Troponin is also within normal limits, low concern for acute cardiac etiology of symptoms especially given no dyspnea or chest pain at this time.  - CTA intracranial and extracranial unremarkable, patient given small dose of meclizine  for subjective dizziness which resolved.  Per ophthalmology recommendations, they believe it is likely in the setting of dry eye syndrome and provided recommendations which were provided at discharge to patient.    Medications Ordered/Administered in the ED   prochlorperazine  (COMPAZINE ) 5 mg/mL injection (10 mg  Intravenous Given 03/31/24 1611)   diphenhydrAMINE  (BENADRYL ) 50 mg/mL injection (25 mg Intravenous Given 03/31/24 1611)   morphine  4 mg/mL injection (4 mg Intravenous Given 03/31/24 1611)   NS bolus infusion 1,000 mL (0 mL Intravenous Stopped 03/31/24 1730)   iopamidol  (ISOVUE -370) 76% solution (50 mL Intravenous Given 03/31/24 1818)   iopamidol  (ISOVUE -370) 76% solution (50 mL Intravenous Given 03/31/24 1828)   meclizine  (ANTIVERT ) tablet (12.5 mg Oral Given 03/31/24 1943)   traMADol  (ULTRAM ) tablet (50 mg Oral Given 04/01/24 0058)   sucralfate  (  CARAFATE ) tablet (1 g Oral Given 04/01/24 0058)   hydrOXYzine  (ATARAX ) 10mg  per 5mL oral liquid (25 mg Oral Given 04/01/24 0135)   levothyroxine  (SYNTHROID ) tablet (100 mcg Oral Given 04/01/24 0758)     Clinical Impression   Dryness of eye, unspecified laterality (Primary)   Headache   Dizziness   Lagophthalmos, unspecified lagophthalmos type, unspecified laterality   AKI (acute kidney injury) (CMS HCC)       Disposition: Discharged         I am scribing for, and in the presence of, Dr. Honora Penta, for services provided on 03/31/2024  [facility] SCRIBE    // Valetta Sharps SCRIBE  03/31/2024, 15:22  I personally performed the services described in this documentation, as scribed  in my presence, and it is both accurate  and complete.    Honora Penta, MD

## 2024-04-01 MED ORDER — RANOLAZINE ER 500 MG TABLET,EXTENDED RELEASE,12 HR
500.0000 mg | ORAL_TABLET | Freq: Two times a day (BID) | ORAL | Status: DC
  Administered 2024-04-01 (×2): 500 mg via ORAL
  Filled 2024-04-01 (×2): qty 1

## 2024-04-01 MED ORDER — TRAMADOL 50 MG TABLET
50.0000 mg | ORAL_TABLET | ORAL | Status: AC
  Administered 2024-04-01: 50 mg via ORAL
  Filled 2024-04-01: qty 1

## 2024-04-01 MED ORDER — LEVOTHYROXINE 100 MCG TABLET
100.0000 ug | ORAL_TABLET | ORAL | Status: AC
  Administered 2024-04-01: 100 ug via ORAL
  Filled 2024-04-01: qty 1

## 2024-04-01 MED ORDER — HYDROXYZINE HCL 10 MG/5 ML ORAL SOLUTION
25.0000 mg | ORAL | Status: AC
  Administered 2024-04-01: 25 mg via ORAL
  Filled 2024-04-01: qty 12.5

## 2024-04-01 MED ORDER — PREGABALIN 50 MG CAPSULE
50.0000 mg | ORAL_CAPSULE | Freq: Three times a day (TID) | ORAL | Status: DC
  Administered 2024-04-01 (×2): 50 mg via ORAL
  Filled 2024-04-01 (×2): qty 1

## 2024-04-01 MED ORDER — ROSUVASTATIN 5 MG TABLET
10.0000 mg | ORAL_TABLET | Freq: Every evening | ORAL | Status: DC
  Administered 2024-04-01: 10 mg via ORAL
  Filled 2024-04-01: qty 2

## 2024-04-01 MED ORDER — SUCRALFATE 1 GRAM TABLET
1.0000 g | ORAL_TABLET | ORAL | Status: AC
  Administered 2024-04-01: 1 g via ORAL
  Filled 2024-04-01: qty 1

## 2024-04-01 NOTE — ED Nurses Note (Signed)
 Care management at bedside at this time. Attempting to find patient a ride home.

## 2024-04-01 NOTE — ED Nurses Note (Signed)
 Patient given AVS, patient verbalized understanding of discharge instructions. No questions or concerns voiced at this time. PIV removed, bleeding controlled. Pt ambulated out of department with all belongings.

## 2024-04-01 NOTE — Care Management Notes (Signed)
 [facility]  Care Management Note    Patient Name: Kathleen Rios  Date of Birth: 07-24-1960  Sex: female  Date/Time of Admission: 03/31/2024  3:04 PM  Room/Bed: OTF/OTF  Payor: HUMANA MEDICARE / Plan: HUMANA CHOICE PPO / Product Type: PPO /    LOS: 0 days   Primary Care Providers:  Ruther Nest, DO, DO (General)    Admitting Diagnosis:  Visual Disturbance    Assessment:   Per Supervisor, Bonny DRS was arranged.  Pt's daughter was updated.  Bedside nurse was updated to assist pt to the ED waiting room.  Will follow.      The patient will continue to be evaluated for developing discharge needs.     Case Manager: Lamar Tonnie Sharper, [place]  Phone: [phone]

## 2024-04-01 NOTE — ED Nurses Note (Signed)
 Patient resting comfortably in bed, HOB slightly elevated, respirations even and unlabored and call bell within reach. No needs voiced at this time.

## 2024-04-01 NOTE — ED Nurses Note (Signed)
 Report given to Sonnie, Charity fundraiser. Care transferred at this time.    Reeshemah Nazaryan, RN

## 2024-04-01 NOTE — Care Management Notes (Addendum)
 [facility]  Care Management Note    Patient Name: Kathleen Rios  Date of Birth: [birth year]  Sex: female  Date/Time of Admission: 03/31/2024  3:04 PM  Room/Bed: ER 14/14A  Payor: HUMANA MEDICARE / Plan: HUMANA CHOICE PPO / Product Type: PPO /    LOS: 0 days   Primary Care Providers:  Ruther Nest, DO, DO (General)    Admitting Diagnosis:  Visual Disturbance    Assessment:      04/01/24 0131   CM Complete   Assessment Type ED   Interventions needed? Yes   Chart Reviewed Yes   Patient status? Discharged     MSW consulted by bedside RN to assist with transportation home. Per RN, patient doesn't have anyone she can call. Patient does not meet medical necessity for EMS. Address is too far for security. MSW called and spoke with patient's son, Jodie, and daughter-in-law, Jeanna. Jodie stated that he had abdominal surgery recently, so he cannot pick her up. Jeanna stated that she has been battling a stomach bug the last few days, so she is unsure if she would be able to come pick patient up tomorrow or not. MSW explained that she can call patient's insurance to see if she has a transport benefit, and MSW can see if CM driver is working on 11/05/71 and can transport. MSW explained to son and DIL that if neither of those options don't pan out, then someone would have to come pick her up or pay for her to take an Uber/Lyft. DIL stated that if those options don't pan out, then they can try to reach out to some of their friends to see if they can pick her up. MSW stated that she will keep them posted.     MSW reviewed chart, and patient has [facility]. MSW called [facility], and representative stated that patient does not have a transportation benefit with her insurance. MSW will reach out to CM supervisors at 7:30AM to see if CM driver has availability. Patient updated.     Update 0720AM: MSW sent secure chat message to [facility] Supervisors to see if CM Driver can be used today to take patient home, and per CM  Supervisor Simpson, he will look into this and follow up with dayshift MSW, Ozell. CM will continue to follow.     Discharge Plan:  Home (Patient/Family Member/other) (code 1)    The patient will continue to be evaluated for developing discharge needs.     Case Manager: Powell Moor, SOCIAL WORKER  Phone: [phone]
